# Patient Record
Sex: Female | Born: 1955 | Race: White | Hispanic: No | Smoking: Current some day smoker
Health system: Southern US, Community
[De-identification: ages and names within clinical notes are randomized; demographics above are authoritative.]

## PROBLEM LIST (undated history)

## (undated) DIAGNOSIS — I10 Essential (primary) hypertension: Secondary | ICD-10-CM

## (undated) DIAGNOSIS — K219 Gastro-esophageal reflux disease without esophagitis: Secondary | ICD-10-CM

## (undated) DIAGNOSIS — E785 Hyperlipidemia, unspecified: Secondary | ICD-10-CM

## (undated) HISTORY — PX: OTHER SURGICAL HISTORY: SHX169

## (undated) HISTORY — PX: BLADDER SUSPENSION: SHX72

---

## 2004-09-17 ENCOUNTER — Emergency Department: Payer: Self-pay | Admitting: Emergency Medicine

## 2005-05-21 ENCOUNTER — Emergency Department: Payer: Self-pay | Admitting: Emergency Medicine

## 2005-06-02 ENCOUNTER — Emergency Department: Payer: Self-pay | Admitting: Emergency Medicine

## 2005-09-22 ENCOUNTER — Other Ambulatory Visit: Payer: Self-pay

## 2005-09-22 ENCOUNTER — Emergency Department: Payer: Self-pay | Admitting: Emergency Medicine

## 2006-05-24 HISTORY — PX: CARPAL TUNNEL RELEASE: SHX101

## 2006-08-22 ENCOUNTER — Ambulatory Visit: Payer: Self-pay | Admitting: Orthopedic Surgery

## 2006-08-25 ENCOUNTER — Ambulatory Visit: Payer: Self-pay | Admitting: Orthopedic Surgery

## 2007-01-31 ENCOUNTER — Emergency Department: Payer: Self-pay | Admitting: Emergency Medicine

## 2007-01-31 ENCOUNTER — Other Ambulatory Visit: Payer: Self-pay

## 2007-10-04 ENCOUNTER — Ambulatory Visit: Payer: Self-pay | Admitting: Obstetrics and Gynecology

## 2007-11-27 ENCOUNTER — Ambulatory Visit: Payer: Self-pay | Admitting: Gastroenterology

## 2014-09-06 ENCOUNTER — Other Ambulatory Visit: Payer: Self-pay | Admitting: Oncology

## 2014-09-06 DIAGNOSIS — Z139 Encounter for screening, unspecified: Secondary | ICD-10-CM

## 2014-09-06 DIAGNOSIS — M8580 Other specified disorders of bone density and structure, unspecified site: Secondary | ICD-10-CM

## 2014-09-25 ENCOUNTER — Inpatient Hospital Stay: Payer: Self-pay | Attending: Oncology | Admitting: *Deleted

## 2014-09-25 ENCOUNTER — Ambulatory Visit
Admission: RE | Admit: 2014-09-25 | Discharge: 2014-09-25 | Disposition: A | Discharge status: Home / Self Care | Payer: Self-pay | Source: Ambulatory Visit | Attending: Oncology | Admitting: Oncology

## 2014-09-25 VITALS — BP 139/97 | HR 72 | Temp 97.2°F | Resp 18 | Ht 61.42 in | Wt 265.4 lb

## 2014-09-25 DIAGNOSIS — Z139 Encounter for screening, unspecified: Secondary | ICD-10-CM

## 2014-09-25 DIAGNOSIS — Z0000 Encounter for general adult medical examination without abnormal findings: Secondary | ICD-10-CM

## 2014-09-25 NOTE — Progress Notes (Signed)
Subjective:     Patient ID: Bianca Fitzpatrick, female   DOB: [birth year], 57 y.o.   MRN: [id number]  HPI   Review of Systems     Objective:   Physical Exam  Pulmonary/Chest: Right breast exhibits no inverted nipple, no mass, no nipple discharge, no skin change and no tenderness. Left breast exhibits no inverted nipple, no mass, no nipple discharge, no skin change and no tenderness.    Genitourinary: Rectum normal. Uterus is not deviated, not enlarged, not fixed and not tender. Cervix exhibits no discharge and no friability. Right adnexum displays no mass, no tenderness and no fullness. Left adnexum displays no mass, no tenderness and no fullness.       Assessment:     59 year old White female presents to [facility] for cbe, pap and mammogram.  Clinical breast exam unremarkable.  Screening mammogram ordered.  Pelvic exam without masses or lesions.  Rectocele noted.  Difficult to visualize the cervical os.  Specimen collected for pap smear.      Plan:     Follow up per protocol.

## 2014-09-27 ENCOUNTER — Encounter: Payer: Self-pay | Admitting: *Deleted

## 2014-09-27 NOTE — Progress Notes (Unsigned)
Patient with normal mammogram resutls.  Letter mailed from the [facility] to inform her of her results.  Patient is to follow-up in one year with annual screening.  HSIS to [place].

## 2014-10-02 ENCOUNTER — Encounter: Payer: Self-pay | Admitting: *Deleted

## 2014-10-03 ENCOUNTER — Encounter: Payer: Self-pay | Admitting: *Deleted

## 2014-10-03 NOTE — Progress Notes (Signed)
Patient ID: Bianca Fitzpatrick, female   DOB: 1955/12/15, 59 y.o.   MRN: [id number] Patient had HPV negative pap.  Mailed letter to inform patient of her normal pap smear.  She will need to return for annual pap in 5 years.

## 2014-10-09 ENCOUNTER — Other Ambulatory Visit: Payer: Self-pay | Admitting: Oncology

## 2015-06-12 ENCOUNTER — Encounter: Payer: Self-pay | Admitting: *Deleted

## 2015-06-13 ENCOUNTER — Encounter: Payer: Self-pay | Admitting: *Deleted

## 2015-06-13 ENCOUNTER — Encounter
Admission: RE | Disposition: A | Discharge status: Home Health | Payer: Self-pay | Source: Ambulatory Visit | Attending: Gastroenterology

## 2015-06-13 ENCOUNTER — Ambulatory Visit: Payer: Managed Care, Other (non HMO) | Admitting: *Deleted

## 2015-06-13 ENCOUNTER — Ambulatory Visit
Admission: RE | Admit: 2015-06-13 | Discharge: 2015-06-13 | Disposition: A | Discharge status: Home Health | Payer: Managed Care, Other (non HMO) | Source: Ambulatory Visit | Attending: Gastroenterology | Admitting: Gastroenterology

## 2015-06-13 DIAGNOSIS — K2960 Other gastritis without bleeding: Secondary | ICD-10-CM | POA: Insufficient documentation

## 2015-06-13 DIAGNOSIS — K222 Esophageal obstruction: Secondary | ICD-10-CM | POA: Insufficient documentation

## 2015-06-13 DIAGNOSIS — K210 Gastro-esophageal reflux disease with esophagitis: Secondary | ICD-10-CM | POA: Diagnosis not present

## 2015-06-13 DIAGNOSIS — K2980 Duodenitis without bleeding: Secondary | ICD-10-CM | POA: Diagnosis not present

## 2015-06-13 DIAGNOSIS — K635 Polyp of colon: Secondary | ICD-10-CM | POA: Insufficient documentation

## 2015-06-13 DIAGNOSIS — I10 Essential (primary) hypertension: Secondary | ICD-10-CM | POA: Diagnosis not present

## 2015-06-13 DIAGNOSIS — K449 Diaphragmatic hernia without obstruction or gangrene: Secondary | ICD-10-CM | POA: Diagnosis not present

## 2015-06-13 DIAGNOSIS — K640 First degree hemorrhoids: Secondary | ICD-10-CM | POA: Diagnosis not present

## 2015-06-13 DIAGNOSIS — Z86010 Personal history of colonic polyps: Secondary | ICD-10-CM | POA: Diagnosis present

## 2015-06-13 DIAGNOSIS — K5730 Diverticulosis of large intestine without perforation or abscess without bleeding: Secondary | ICD-10-CM | POA: Diagnosis not present

## 2015-06-13 DIAGNOSIS — D124 Benign neoplasm of descending colon: Secondary | ICD-10-CM | POA: Diagnosis not present

## 2015-06-13 DIAGNOSIS — K2930 Chronic superficial gastritis without bleeding: Secondary | ICD-10-CM | POA: Diagnosis not present

## 2015-06-13 DIAGNOSIS — Z1211 Encounter for screening for malignant neoplasm of colon: Secondary | ICD-10-CM | POA: Diagnosis present

## 2015-06-13 DIAGNOSIS — E785 Hyperlipidemia, unspecified: Secondary | ICD-10-CM | POA: Diagnosis not present

## 2015-06-13 DIAGNOSIS — K621 Rectal polyp: Secondary | ICD-10-CM | POA: Diagnosis not present

## 2015-06-13 DIAGNOSIS — K644 Residual hemorrhoidal skin tags: Secondary | ICD-10-CM | POA: Insufficient documentation

## 2015-06-13 DIAGNOSIS — K2270 Barrett's esophagus without dysplasia: Secondary | ICD-10-CM | POA: Diagnosis present

## 2015-06-13 HISTORY — DX: Essential (primary) hypertension: I10

## 2015-06-13 HISTORY — PX: COLONOSCOPY WITH PROPOFOL: SHX5780

## 2015-06-13 HISTORY — DX: Gastro-esophageal reflux disease without esophagitis: K21.9

## 2015-06-13 HISTORY — DX: Hyperlipidemia, unspecified: E78.5

## 2015-06-13 HISTORY — PX: ESOPHAGOGASTRODUODENOSCOPY (EGD) WITH PROPOFOL: SHX5813

## 2015-06-13 SURGERY — COLONOSCOPY WITH PROPOFOL
Anesthesia: General

## 2015-06-13 MED ORDER — SODIUM CHLORIDE 0.9 % IV SOLN: INTRAVENOUS | Status: DC

## 2015-06-13 MED ORDER — SODIUM CHLORIDE 0.9 % IV SOLN
INTRAVENOUS | Status: DC
  Administered 2015-06-13: 11:00:00 via INTRAVENOUS

## 2015-06-13 MED ORDER — LACTATED RINGERS IV SOLN
INTRAVENOUS | Status: DC | PRN
  Administered 2015-06-13: 11:00:00 via INTRAVENOUS

## 2015-06-13 MED ORDER — PROPOFOL 10 MG/ML IV BOLUS
INTRAVENOUS | Status: DC | PRN
  Administered 2015-06-13: 100 mg via INTRAVENOUS

## 2015-06-13 MED ORDER — PROPOFOL 500 MG/50ML IV EMUL
INTRAVENOUS | Status: DC | PRN
  Administered 2015-06-13: 150 ug/kg/min via INTRAVENOUS

## 2015-06-13 NOTE — Op Note (Signed)
[facility] Gastroenterology Patient Name: Bianca Fitzpatrick Procedure Date: 06/13/2015 11:06 AM MRN: [id number] Account #: [id number] Date of Birth: 06-Dec-1955 Admit Type: Outpatient Age: 60 Room: [facility] ENDO ROOM 3 Gender: Female Note Status: Finalized Procedure:         Upper GI endoscopy Indications:       Follow-up of Barrett's esophagus Providers:         Lollie Sails, MD Referring MD:      Bethena Roys. Sowles, MD (Referring MD) Medicines:         Monitored Anesthesia Care Complications:     No immediate complications. Procedure:         Pre-Anesthesia Assessment:                    - ASA Grade Assessment: III - A patient with severe                     systemic disease.                    After obtaining informed consent, the endoscope was passed                     under direct vision. Throughout the procedure, the                     patient's blood pressure, pulse, and oxygen saturations                     were monitored continuously. The Olympus GIF-160 endoscope                     (S#. [phone]) was introduced through the mouth, and                     advanced to the third part of duodenum. The upper GI                     endoscopy was accomplished without difficulty. The patient                     tolerated the procedure well. Findings:      A non-obstructing Schatzki ring (acquired) was found at the       gastroesophageal junction. Biopsies were taken with a cold forceps for       histology.      The Z-line was regular. Mucosa was biopsied with a cold forceps for       histology in 4 quadrants.      mild furrowing noted in the mid to upper esophagus. Biopsies taken at       about 28 cm.      A small hiatus hernia was present.      Patchy moderate inflammation characterized by erosions and linear       erosions was found in the gastric antrum. Biopsies were taken with a       cold forceps for histology. Biopsies were taken with a cold forceps for        Helicobacter pylori testing.      The cardia and gastric fundus were normal on retroflexion otherwise.      Patchy minimal inflammation characterized by erythema was found in the       duodenal bulb.      The exam of the duodenum was otherwise normal. Impression:        -  Non-obstructing Schatzki ring. Biopsied.                    - Z-line regular. Biopsied.                    - Small hiatus hernia.                    - Erosive gastritis. Biopsied.                    - Duodenitis. Recommendation:    - Await pathology results.                    - Use Protonix (pantoprazole) 40 mg PO daily daily. Procedure Code(s): --- Professional ---                    [phone], Esophagogastroduodenoscopy, flexible, transoral;                     with biopsy, single or multiple Diagnosis Code(s): --- Professional ---                    K22.2, Esophageal obstruction                    K22.70, Barrett's esophagus without dysplasia                    K44.9, Diaphragmatic hernia without obstruction or gangrene                    K29.60, Other gastritis without bleeding                    K29.80, Duodenitis without bleeding CPT copyright 2014 American Medical Association. All rights reserved. The codes documented in this report are preliminary and upon coder review may  be revised to meet current compliance requirements. Lollie Sails, MD 06/13/2015 11:47:03 AM This report has been signed electronically. Number of Addenda: 0 Note Initiated On: 06/13/2015 11:06 AM      [facility]

## 2015-06-13 NOTE — Transfer of Care (Signed)
Immediate Anesthesia Transfer of Care Note  Patient: Bianca Fitzpatrick  Procedure(s) Performed: Procedure(s): COLONOSCOPY WITH PROPOFOL (N/A) ESOPHAGOGASTRODUODENOSCOPY (EGD) WITH PROPOFOL (N/A)  Patient Location: PACU and Endoscopy Unit  Anesthesia Type:General  Level of Consciousness: awake, alert  and oriented  Airway & Oxygen Therapy: Patient Spontanous Breathing and Patient connected to nasal cannula oxygen  Post-op Assessment: Report given to RN and Post -op Vital signs reviewed and stable  Post vital signs: Reviewed and stable  Last Vitals:  Filed Vitals:   06/13/15 1004  BP: 152/85  Pulse: 66  Temp: 36.3 C  Resp: 18    Complications: No apparent anesthesia complications

## 2015-06-13 NOTE — Anesthesia Preprocedure Evaluation (Signed)
Anesthesia Evaluation  Patient identified by MRN, date of birth, ID band  Reviewed: Allergy & Precautions, NPO status , Patient's Chart, lab work & pertinent test results  Airway Mallampati: II  TM Distance: >3 FB Neck ROM: Full    Dental  (+) Teeth Intact   Pulmonary Current Smoker,  One half pack per day.   Pulmonary exam normal        Cardiovascular Exercise Tolerance: Good hypertension, Pt. on medications Normal cardiovascular exam     Neuro/Psych    GI/Hepatic GERD  Medicated and Controlled,  Endo/Other  Morbid obesity  Renal/GU      Musculoskeletal   Abdominal (+) + obese,  Abdomen: soft.    Peds  Hematology   Anesthesia Other Findings   Reproductive/Obstetrics                             Anesthesia Physical Anesthesia Plan  ASA: III  Anesthesia Plan: General   Post-op Pain Management:    Induction: Intravenous  Airway Management Planned: Nasal Cannula  Additional Equipment:   Intra-op Plan:   Post-operative Plan:   Informed Consent: I have reviewed the patients History and Physical, chart, labs and discussed the procedure including the risks, benefits and alternatives for the proposed anesthesia with the patient or authorized representative who has indicated his/her understanding and acceptance.   Dental advisory given  Plan Discussed with:   Anesthesia Plan Comments:         Anesthesia Quick Evaluation

## 2015-06-13 NOTE — Anesthesia Postprocedure Evaluation (Signed)
Anesthesia Post Note  Patient: Bianca Fitzpatrick  Procedure(s) Performed: Procedure(s) (LRB): COLONOSCOPY WITH PROPOFOL (N/A) ESOPHAGOGASTRODUODENOSCOPY (EGD) WITH PROPOFOL (N/A)  Patient location during evaluation: PACU Anesthesia Type: General Level of consciousness: awake Pain management: pain level controlled Vital Signs Assessment: post-procedure vital signs reviewed and stable Respiratory status: spontaneous breathing Cardiovascular status: blood pressure returned to baseline Postop Assessment: no headache Anesthetic complications: no    Last Vitals:  Filed Vitals:   06/13/15 1241 06/13/15 1252  BP: 127/75 119/75  Pulse: 76 67  Temp:    Resp: 12 33    Last Pain: There were no vitals filed for this visit.               Shantara Goosby M

## 2015-06-13 NOTE — H&P (Signed)
Outpatient short stay form Pre-procedure 06/13/2015 11:14 AM Lollie Sails MD  Primary Physician: Dr. Sabino Snipes  Reason for visit:  EGD and colonoscopy  History of present illness:  Patient is a 60 year old female presenting today for EGD and colonoscopy in regards to her personal history of colon polyps and Barrett's esophagus. Her last EGD was actually in 2009. She has not been taking a proton pump inhibitor. She has been taking ranitidine at times. She denies any dysphagia or clinically significant heartburn.   Current facility-administered medications:  .  0.9 %  sodium chloride infusion, , Intravenous, Continuous, Lollie Sails, MD, Last Rate: 20 mL/hr at 06/13/15 1030 .  0.9 %  sodium chloride infusion, , Intravenous, Continuous, Lollie Sails, MD  Prescriptions prior to admission  Medication Sig Dispense Refill Last Dose  . amLODipine (NORVASC) 5 MG tablet Take 5 mg by mouth daily.   06/13/2015 at 0530  . atorvastatin (LIPITOR) 40 MG tablet Take 40 mg by mouth daily.   06/13/2015 at 0530  . hydrochlorothiazide (HYDRODIURIL) 25 MG tablet Take 25 mg by mouth daily.   06/13/2015 at 0530  . ranitidine (ZANTAC) 75 MG tablet Take 75 mg by mouth 2 (two) times daily.   06/13/2015 at 0530     No Known Allergies   Past Medical History  Diagnosis Date  . GERD (gastroesophageal reflux disease)   . Hypertension   . Hyperlipidemia     Review of systems:      Physical Exam    Heart and lungs: Regular rate and rhythm without rub or gallop, lungs are bilaterally clear.    HEENT: Norm cephalic atraumatic eyes are anicteric    Other:     Pertinant exam for procedure: Soft nontender nondistended bowel sounds positive normoactive    Planned proceedures: EGD, colonoscopy and indicated procedures. I have discussed the risks benefits and complications of procedures to include not limited to bleeding, infection, perforation and the risk of sedation and the patient wishes  to proceed.    Lollie Sails, MD Gastroenterology 06/13/2015  11:14 AM

## 2015-06-13 NOTE — Op Note (Signed)
[facility] Gastroenterology Patient Name: Bianca Fitzpatrick Procedure Date: 06/13/2015 11:04 AM MRN: [id number] Account #: [id number] Date of Birth: 09-22-1955 Admit Type: Outpatient Age: 60 Room: [facility] ENDO ROOM 3 Gender: Female Note Status: Finalized Procedure:         Colonoscopy Indications:       Personal history of colonic polyps Providers:         Lollie Sails, MD Referring MD:      Bethena Roys. Sowles, MD (Referring MD) Medicines:         Monitored Anesthesia Care Complications:     No immediate complications. Procedure:         Pre-Anesthesia Assessment:                    - ASA Grade Assessment: III - A patient with severe                     systemic disease.                    After obtaining informed consent, the colonoscope was                     passed under direct vision. Throughout the procedure, the                     patient's blood pressure, pulse, and oxygen saturations                     were monitored continuously. The Olympus PCF-H180AL                     colonoscope ( S#: [id number] ) was introduced through the                     anus and advanced to the the cecum, identified by                     appendiceal orifice and ileocecal valve. The colonoscopy                     was performed with moderate difficulty. Successful                     completion of the procedure was aided by changing the                     patient to a supine position, changing the patient to a                     prone position and using manual pressure. The patient                     tolerated the procedure well. The quality of the bowel                     preparation was fair except the cecum was poor. Findings:      Multiple small and large-mouthed diverticula were found in the sigmoid       colon, in the descending colon, in the transverse colon and in the       ascending colon.      A 4 mm polyp was found in the descending colon. The polyp was sessile.        The polyp  was removed with a cold snare. Resection and retrieval were       complete.      A 3 mm polyp was found in the descending colon. The polyp was sessile.       The polyp was removed with a cold biopsy forceps. Resection and       retrieval were complete.      A 3 mm polyp was found in the mid sigmoid colon. The polyp was sessile.       The polyp was removed with a cold biopsy forceps. Resection and       retrieval were complete.      Seven sessile polyps were found in the recto-sigmoid colon. The polyps       were 1 to 2 mm in size. These polyps were removed with a cold biopsy       forceps. Resection and retrieval were complete.      Four sessile polyps were found in the rectum. The polyps were 1 to 3 mm       in size. These polyps were removed with a cold biopsy forceps. Resection       and retrieval were complete.      Non-bleeding external and internal hemorrhoids were found during       anoscopy. The hemorrhoids were small and Grade I (internal hemorrhoids       that do not prolapse).      The digital rectal exam was normal otherwise. Impression:        - Diverticulosis in the sigmoid colon, in the descending                     colon, in the transverse colon and in the ascending colon.                    - One 4 mm polyp in the descending colon. Resected and                     retrieved.                    - One 3 mm polyp in the descending colon. Resected and                     retrieved.                    - One 3 mm polyp in the mid sigmoid colon. Resected and                     retrieved.                    - Seven 1 to 2 mm polyps at the recto-sigmoid colon.                     Resected and retrieved.                    - Four 1 to 3 mm polyps in the rectum. Resected and                     retrieved.                    - Non-bleeding external and internal hemorrhoids. Recommendation:    - Discharge patient to home.                    -  Await pathology  results.                    - Telephone GI clinic for pathology results in 1 week. Procedure Code(s): --- Professional ---                    [phone], Colonoscopy, flexible; with removal of tumor(s),                     polyp(s), or other lesion(s) by snare technique                    45380, 25, Colonoscopy, flexible; with biopsy, single or                     multiple Diagnosis Code(s): --- Professional ---                    K64.0, First degree hemorrhoids                    D12.4, Benign neoplasm of descending colon                    D12.5, Benign neoplasm of sigmoid colon                    D12.7, Benign neoplasm of rectosigmoid junction                    K62.1, Rectal polyp                    Z86.010, Personal history of colonic polyps                    K57.30, Diverticulosis of large intestine without                     perforation or abscess without bleeding CPT copyright 2014 American Medical Association. All rights reserved. The codes documented in this report are preliminary and upon coder review may  be revised to meet current compliance requirements. Lollie Sails, MD 06/13/2015 12:29:33 PM This report has been signed electronically. Number of Addenda: 0 Note Initiated On: 06/13/2015 11:04 AM Scope Withdrawal Time: 0 hours 18 minutes 23 seconds  Total Procedure Duration: 0 hours 33 minutes 53 seconds       [facility]

## 2015-06-16 LAB — SURGICAL PATHOLOGY

## 2015-06-18 ENCOUNTER — Encounter: Payer: Self-pay | Admitting: Gastroenterology

## 2015-06-27 ENCOUNTER — Encounter: Payer: Self-pay | Admitting: Gastroenterology

## 2015-06-27 NOTE — Addendum Note (Signed)
Addendum  created 06/27/15 [id number] by Elyse Hsu, MD   Modules edited: Anesthesia Events, Narrator   Narrator:  Narrator: Event Log Edited

## 2015-09-30 ENCOUNTER — Other Ambulatory Visit: Payer: Self-pay | Admitting: Family Medicine

## 2015-09-30 DIAGNOSIS — Z1231 Encounter for screening mammogram for malignant neoplasm of breast: Secondary | ICD-10-CM

## 2016-01-01 ENCOUNTER — Ambulatory Visit
Admission: RE | Admit: 2016-01-01 | Discharge: 2016-01-01 | Disposition: A | Discharge status: Home / Self Care | Payer: Managed Care, Other (non HMO) | Source: Ambulatory Visit | Attending: Family Medicine | Admitting: Family Medicine

## 2016-01-01 DIAGNOSIS — Z1231 Encounter for screening mammogram for malignant neoplasm of breast: Secondary | ICD-10-CM | POA: Diagnosis not present

## 2016-04-19 ENCOUNTER — Encounter: Payer: Self-pay | Admitting: Emergency Medicine

## 2016-04-19 ENCOUNTER — Emergency Department
Admission: EM | Admit: 2016-04-19 | Discharge: 2016-04-20 | Disposition: A | Discharge status: Home / Self Care | Payer: Managed Care, Other (non HMO) | Attending: Emergency Medicine | Admitting: Emergency Medicine

## 2016-04-19 DIAGNOSIS — Z79899 Other long term (current) drug therapy: Secondary | ICD-10-CM | POA: Insufficient documentation

## 2016-04-19 DIAGNOSIS — F17210 Nicotine dependence, cigarettes, uncomplicated: Secondary | ICD-10-CM | POA: Diagnosis not present

## 2016-04-19 DIAGNOSIS — I83892 Varicose veins of left lower extremities with other complications: Secondary | ICD-10-CM

## 2016-04-19 DIAGNOSIS — I10 Essential (primary) hypertension: Secondary | ICD-10-CM | POA: Diagnosis not present

## 2016-04-19 DIAGNOSIS — I8392 Asymptomatic varicose veins of left lower extremity: Secondary | ICD-10-CM | POA: Diagnosis present

## 2016-04-19 MED ORDER — LIDOCAINE-EPINEPHRINE (PF) 1 %-1:200000 IJ SOLN
10.0000 mL | Freq: Once | INTRAMUSCULAR | Status: AC
  Administered 2016-04-20: 10 mL
  Filled 2016-04-19: qty 30

## 2016-04-19 NOTE — ED Triage Notes (Signed)
Patient states that around 9pm she picked a scab on her left leg and blood shot out and has been bleeding since. Husband applied a dressing at home and patient still bleeding.

## 2016-04-19 NOTE — ED Provider Notes (Signed)
[facility] Emergency Department Provider Note  ____________________________________________  Time seen: Approximately 11:00 PM  I have reviewed the triage vital signs and the nursing notes.   HISTORY  Chief Complaint Leg Injury    HPI Bianca Fitzpatrick is a 60 y.o. female who presents emergency department for bleeding wound to her left leg. Patient states that she is getting ready to get into bed when she felt something "itching" in her leg. As she scratched, she felt something that felt like a scab. She states that her fingernail is tenderness and then she proceeded to have bleeding from the site. Patient states that it was a steady stream of blood. She denies any pulsating. Patient denies any pain to the area. Patient does endorse having varicose veins. She denies any previous history of same. She is not on blood thinners.   Past Medical History:  Diagnosis Date  . GERD (gastroesophageal reflux disease)   . Hyperlipidemia   . Hypertension     There are no active problems to display for this patient.   Past Surgical History:  Procedure Laterality Date  . BLADDER SUSPENSION    . CARPAL TUNNEL RELEASE Right 2008  . CESAREAN SECTION     x 2  . COLONOSCOPY WITH PROPOFOL N/A 06/13/2015   Procedure: COLONOSCOPY WITH PROPOFOL;  Surgeon: Lollie Sails, MD;  Location: [facility] ENDOSCOPY;  Service: Endoscopy;  Laterality: N/A;  . ESOPHAGOGASTRODUODENOSCOPY (EGD) WITH PROPOFOL N/A 06/13/2015   Procedure: ESOPHAGOGASTRODUODENOSCOPY (EGD) WITH PROPOFOL;  Surgeon: Lollie Sails, MD;  Location: [facility] ENDOSCOPY;  Service: Endoscopy;  Laterality: N/A;  . right carpal tunnel release      Prior to Admission medications   Medication Sig Start Date End Date Taking? Authorizing Provider  amLODipine (NORVASC) 5 MG tablet Take 5 mg by mouth daily.    Historical Provider, MD  atorvastatin (LIPITOR) 40 MG tablet Take 40 mg by mouth daily.    Historical Provider, MD   hydrochlorothiazide (HYDRODIURIL) 25 MG tablet Take 25 mg by mouth daily.    Historical Provider, MD  ranitidine (ZANTAC) 75 MG tablet Take 75 mg by mouth 2 (two) times daily.    Historical Provider, MD    Allergies Patient has no known allergies.  Family History  Problem Relation Age of Onset  . Breast cancer Paternal Aunt   . Breast cancer Maternal Aunt   . Breast cancer Cousin     Social History Social History  Substance Use Topics  . Smoking status: Current Some Day Smoker    Packs/day: 0.50    Years: 30.00    Types: Cigarettes  . Smokeless tobacco: Not on file     Comment: quitline information given  . Alcohol use 0.6 oz/week    1 Standard drinks or equivalent per week     Review of Systems  Constitutional: No fever/chills Cardiovascular: no chest pain. Respiratory: no cough. No SOB. Musculoskeletal: Negative for musculoskeletal pain. Skin:Positive for bleeding wound to the left lower leg Neurological: Negative for headaches, focal weakness or numbness. 10-point ROS otherwise negative.  ____________________________________________   PHYSICAL EXAM:  VITAL SIGNS: ED Triage Vitals  Enc Vitals Group     BP 04/19/16 2212 121/72     Pulse Rate 04/19/16 2212 80     Resp 04/19/16 2212 18     Temp 04/19/16 2212 97.7 F (36.5 C)     Temp Source 04/19/16 2212 Oral     SpO2 04/19/16 2212 95 %     Weight  04/19/16 2213 227 lb (103 kg)     Height 04/19/16 2213 5' (1.524 m)     Head Circumference --      Peak Flow --      Pain Score --      Pain Loc --      Pain Edu? --      Excl. in [place]? --      Constitutional: Alert and oriented. Well appearing and in no acute distress. Eyes: Conjunctivae are normal. PERRL. EOMI. Head: Atraumatic. Neck: No stridor.    Cardiovascular: Normal rate, regular rhythm. Normal S1 and S2.  Good peripheral circulation. Respiratory: Normal respiratory effort without tachypnea or retractions. Lungs CTAB. Good air entry to the bases  with no decreased or absent breath sounds. Musculoskeletal: Full range of motion to all extremities. No gross deformities appreciated. Neurologic:  Normal speech and language. No gross focal neurologic deficits are appreciated.  Skin:  Skin is warm, dry and intact. Small open lesion noted to left lateral lower leg. Area is covered in bandages, when this is removed bleeding doesn't resume. This appears to be bleeding varicose vein. No pulsating. Nontender to palpation. Dorsalis pedis pulse intact distally. Sensation intact distally. Psychiatric: Mood and affect are normal. Speech and behavior are normal. Patient exhibits appropriate insight and judgement.   ____________________________________________   LABS (all labs ordered are listed, but only abnormal results are displayed)  Labs Reviewed - No data to display ____________________________________________  EKG   ____________________________________________  RADIOLOGY   No results found.  ____________________________________________    PROCEDURES  Procedure(s) performed:    Wound repair Date/Time: 04/20/2016 12:18 AM Performed by: Betha Loa D Authorized by: Betha Loa D  Consent: Verbal consent obtained. Consent given by: patient Patient understanding: patient states understanding of the procedure being performed Local anesthesia used: yes Anesthesia: local infiltration  Anesthesia: Local anesthesia used: yes Local Anesthetic: lidocaine 1% with epinephrine Anesthetic total: 8 mL Comments: Bleeding varicose pain is infiltrated using lidocaine with epinephrine. Direct pressure is not applied after injection. After 10 minutes, the wound is rechecked with complete cessation of bleeding. Pressure dressing is reapplied and patient is instructed to leave in place for 2 days. Wound care retractions are given to patient to return for any bleeding through or increased pain. Otherwise, patient will remove  dressing in 2 days and cover area with Band-Aid until scab has completely resolved.       Medications  lidocaine-EPINEPHrine (XYLOCAINE-EPINEPHrine) 1 %-1:200000 (PF) injection 10 mL (not administered)     ____________________________________________   INITIAL IMPRESSION / ASSESSMENT AND PLAN / ED COURSE  Pertinent labs & imaging results that were available during my care of the patient were reviewed by me and considered in my medical decision making (see chart for details).  Review of the [place] CSRS was performed in accordance of the [place] prior to dispensing any controlled drugs.  Clinical Course     Patient's diagnosis is consistent with Bleeding varicose pain to the left lower extremity. This is controlled after infiltration with lidocaine with epi and direct pressure. No return of bleeding. Pressure dressing is reapplied to the site and patient is instructed to leave in place for 2 days. She is given wound care section. No prescriptions at this time. Patient will follow up with primary care as needed..  Patient is given ED precautions to return to the ED for any worsening or new symptoms.     ____________________________________________  FINAL CLINICAL IMPRESSION(S) / ED DIAGNOSES  Final diagnoses:  Bleeding from varicose veins of left lower extremity      NEW MEDICATIONS STARTED DURING THIS VISIT:  New Prescriptions   No medications on file        This chart was dictated using voice recognition software/Dragon. Despite best efforts to proofread, errors can occur which can change the meaning. Any change was purely unintentional.    Darletta Moll, PA-C 04/20/16 0024    Schuyler Amor, MD 04/21/16 [phone]

## 2016-11-03 ENCOUNTER — Other Ambulatory Visit: Payer: Self-pay | Admitting: Family Medicine

## 2016-11-03 DIAGNOSIS — Z1231 Encounter for screening mammogram for malignant neoplasm of breast: Secondary | ICD-10-CM

## 2017-01-03 ENCOUNTER — Ambulatory Visit: Payer: Managed Care, Other (non HMO)

## 2017-06-17 ENCOUNTER — Ambulatory Visit
Admission: RE | Admit: 2017-06-17 | Discharge: 2017-06-17 | Disposition: A | Discharge status: Home / Self Care | Payer: PRIVATE HEALTH INSURANCE | Source: Ambulatory Visit | Attending: Family Medicine | Admitting: Family Medicine

## 2017-06-17 DIAGNOSIS — Z1231 Encounter for screening mammogram for malignant neoplasm of breast: Secondary | ICD-10-CM

## 2018-08-03 ENCOUNTER — Other Ambulatory Visit: Payer: Self-pay | Admitting: Family Medicine

## 2018-12-27 ENCOUNTER — Other Ambulatory Visit: Payer: Self-pay | Admitting: Unknown Physician Specialty

## 2018-12-27 DIAGNOSIS — H903 Sensorineural hearing loss, bilateral: Secondary | ICD-10-CM

## 2019-01-02 ENCOUNTER — Other Ambulatory Visit: Payer: Self-pay | Admitting: Physician Assistant

## 2019-01-02 DIAGNOSIS — Z1231 Encounter for screening mammogram for malignant neoplasm of breast: Secondary | ICD-10-CM

## 2019-01-07 ENCOUNTER — Ambulatory Visit
Admission: RE | Admit: 2019-01-07 | Discharge: 2019-01-07 | Disposition: A | Discharge status: Home / Self Care | Payer: Self-pay | Source: Ambulatory Visit | Attending: Unknown Physician Specialty | Admitting: Unknown Physician Specialty

## 2019-01-07 DIAGNOSIS — H903 Sensorineural hearing loss, bilateral: Secondary | ICD-10-CM | POA: Insufficient documentation

## 2019-01-07 LAB — POCT I-STAT CREATININE: Creatinine, Ser: 0.7 mg/dL (ref 0.44–1.00)

## 2019-01-07 MED ORDER — GADOBUTROL 1 MMOL/ML IV SOLN
10.0000 mL | Freq: Once | INTRAVENOUS | Status: AC | PRN
Start: 2019-01-07 — End: 2019-01-07
  Administered 2019-01-07: 10 mL via INTRAVENOUS

## 2019-10-02 ENCOUNTER — Ambulatory Visit
Admission: RE | Admit: 2019-10-02 | Discharge: 2019-10-02 | Disposition: A | Discharge status: Home / Self Care | Payer: PRIVATE HEALTH INSURANCE | Source: Ambulatory Visit | Attending: Physician Assistant | Admitting: Physician Assistant

## 2019-10-02 DIAGNOSIS — Z1231 Encounter for screening mammogram for malignant neoplasm of breast: Secondary | ICD-10-CM | POA: Insufficient documentation

## 2019-11-09 ENCOUNTER — Ambulatory Visit
Admission: RE | Admit: 2019-11-09 | Discharge: 2019-11-09 | Disposition: A | Discharge status: Home / Self Care | Payer: Disability Insurance | Source: Ambulatory Visit | Attending: Internal Medicine | Admitting: Internal Medicine

## 2019-11-09 ENCOUNTER — Ambulatory Visit
Admission: RE | Admit: 2019-11-09 | Discharge: 2019-11-09 | Disposition: A | Discharge status: Home / Self Care | Payer: Disability Insurance | Attending: Internal Medicine | Admitting: Internal Medicine

## 2019-11-09 ENCOUNTER — Other Ambulatory Visit: Payer: Self-pay | Admitting: Internal Medicine

## 2019-11-09 DIAGNOSIS — M25551 Pain in right hip: Secondary | ICD-10-CM | POA: Insufficient documentation

## 2019-11-09 DIAGNOSIS — M25552 Pain in left hip: Secondary | ICD-10-CM

## 2020-08-27 IMAGING — CR DG HIP (WITH OR WITHOUT PELVIS) 2-3V*R*
3 series · 3 of 3 positions shown · non-contrast
Comparison: None.

CLINICAL DATA: Bilateral hip pain radiating into the legs for
years. No known injury.

EXAM:
DG HIP (WITH OR WITHOUT PELVIS) 2-3V LEFT; DG HIP (WITH OR WITHOUT
PELVIS) 2-3V RIGHT

[pelvis ap]
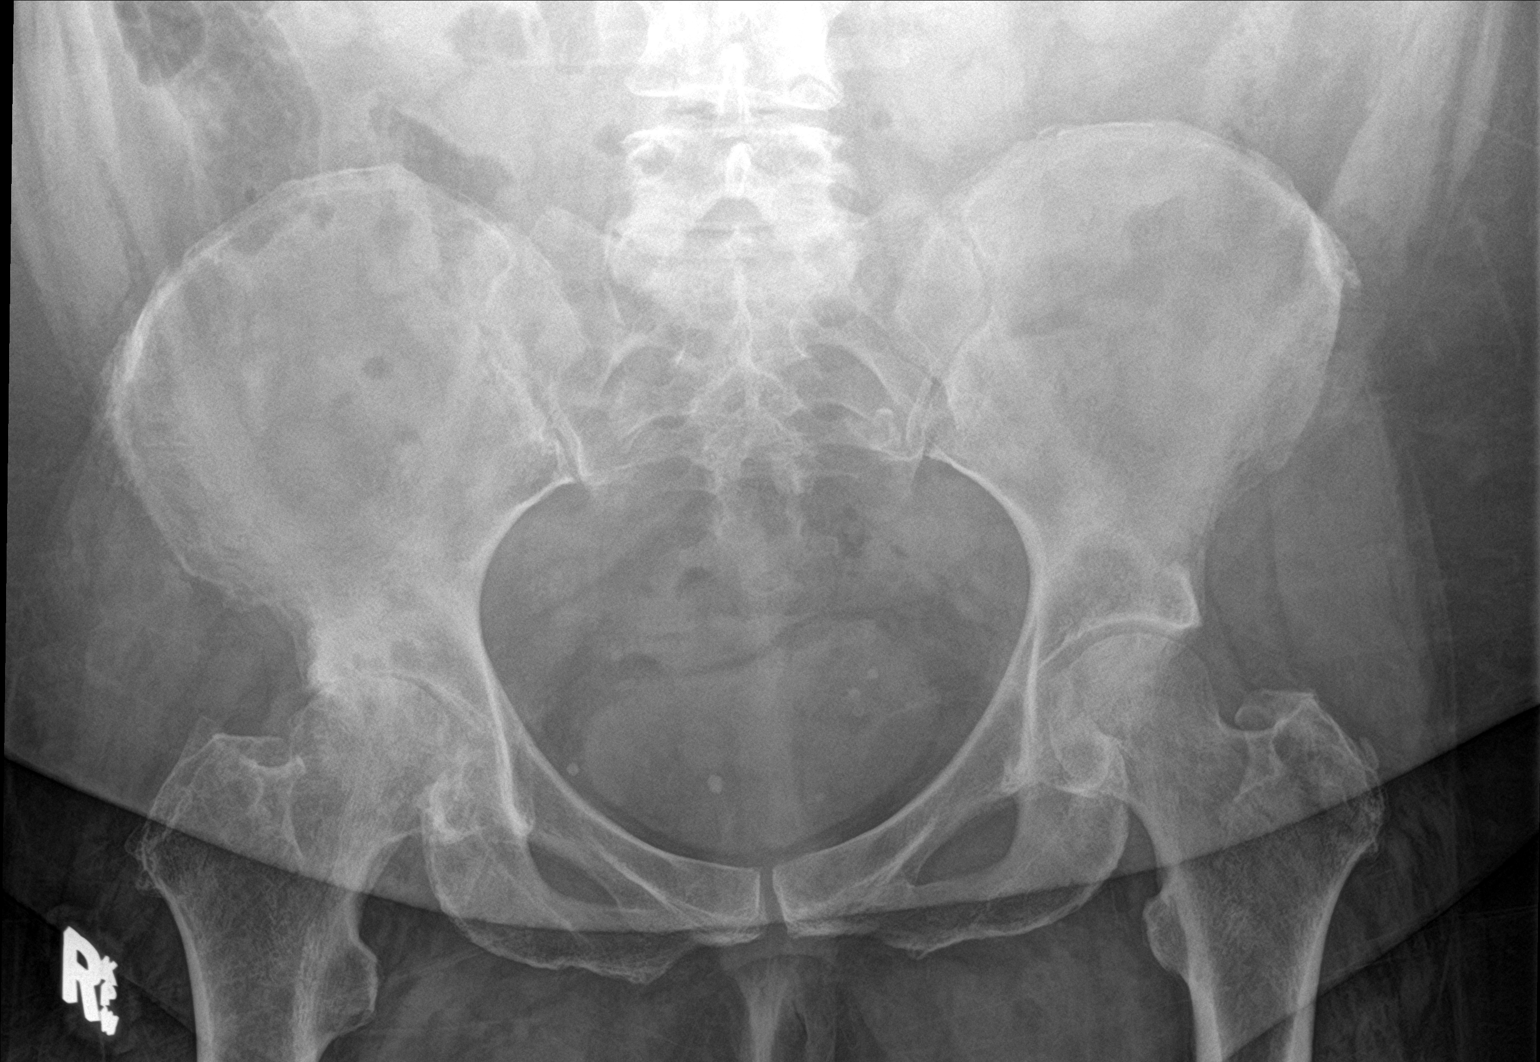

[hip ap]
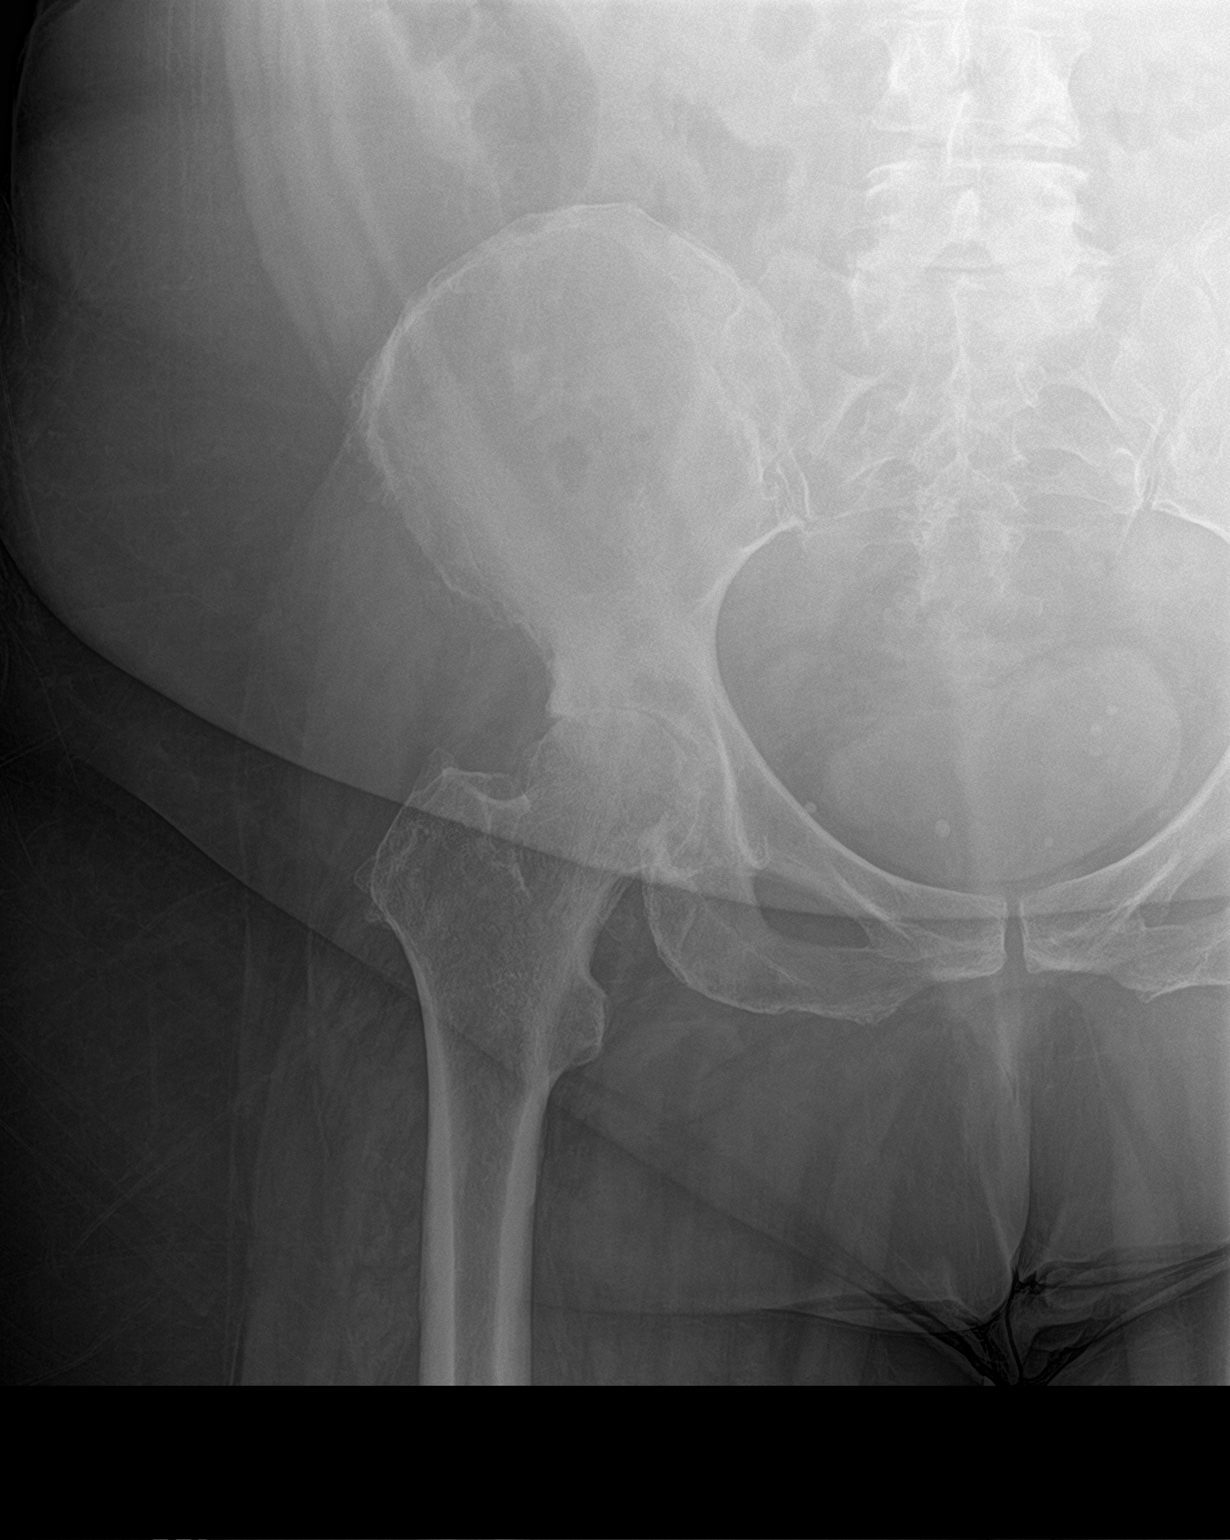

[hip lat]
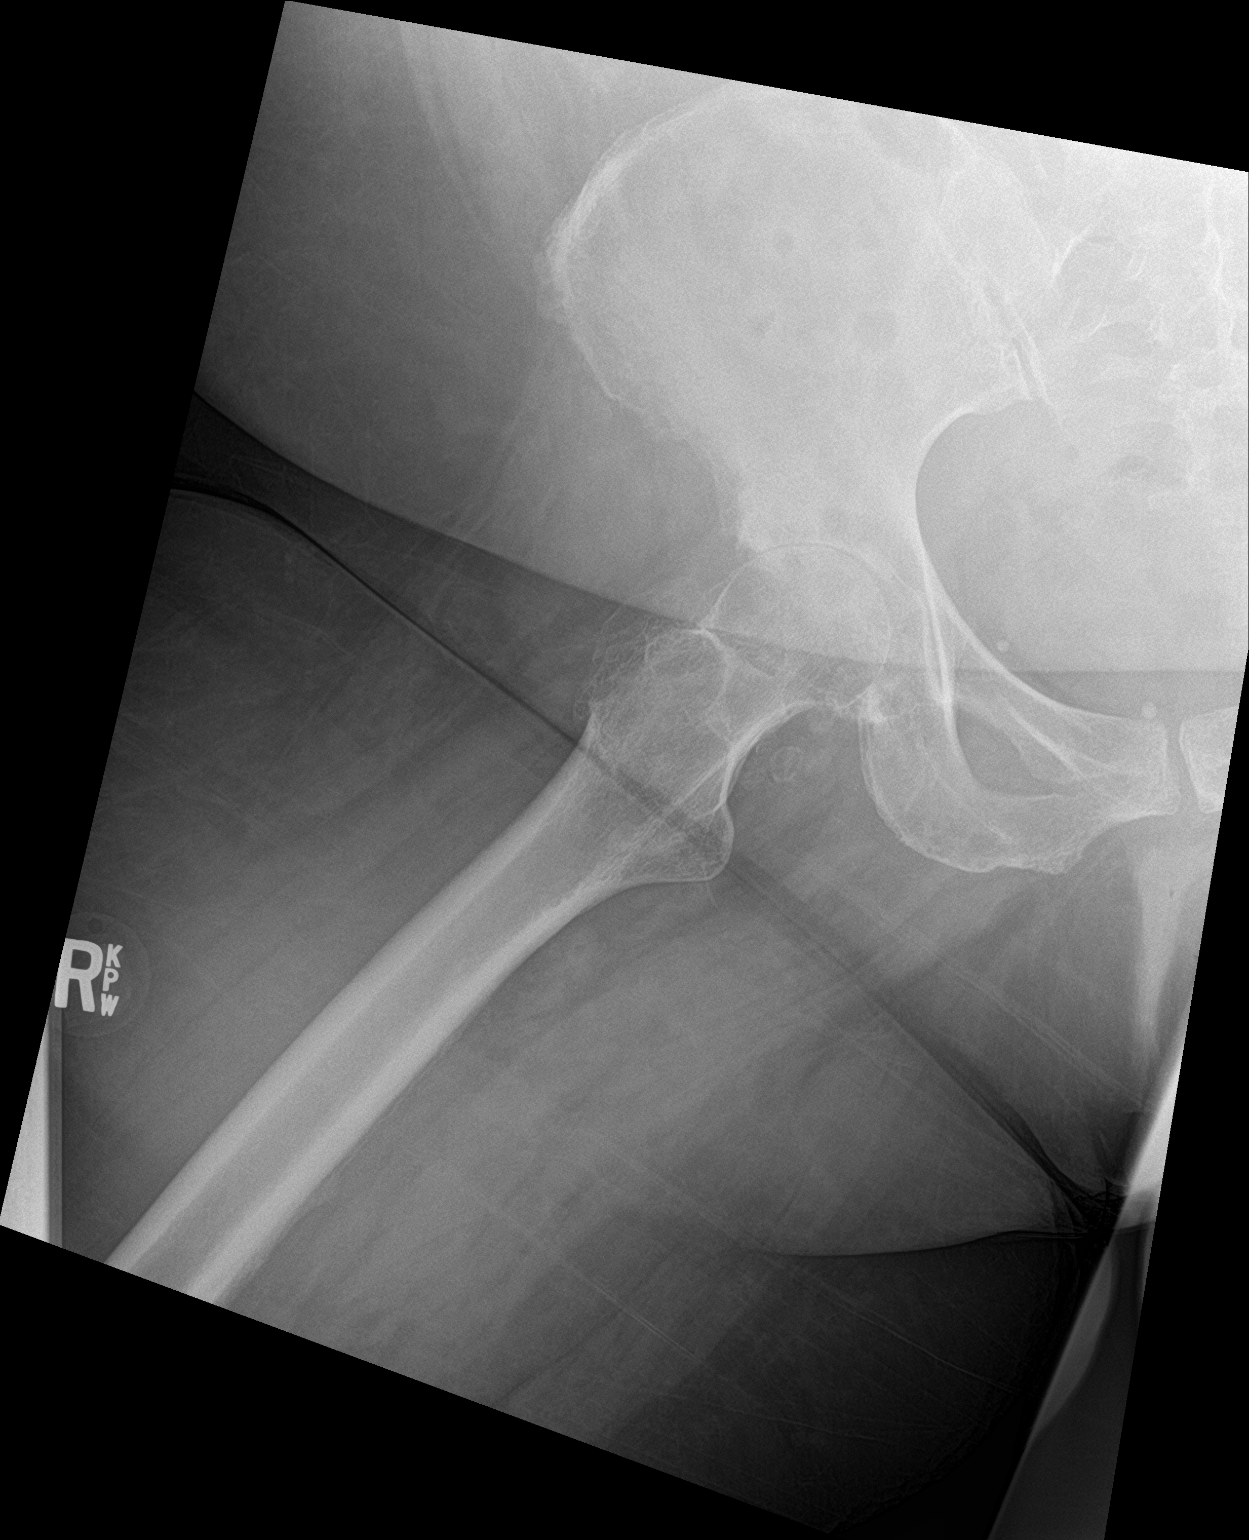

[3 of 3 positions shown; findings below may reference images not displayed]

FINDINGS: There is no acute bony or joint abnormality. The patient has severe
right hip osteoarthritis with bone-on-bone joint space narrowing,
subchondral sclerosis and some subchondral cyst formation. 1 cm in
diameter calcification projecting along the medial margin of the
femoral neck may be a loose body in the joint. Left hip joint space
is preserved. No lytic or sclerotic lesion. No avascular necrosis of
the femoral heads.
IMPRESSION: Advanced right hip osteoarthritis.

Negative left hip.

## 2020-08-27 IMAGING — CR DG HIP (WITH OR WITHOUT PELVIS) 2-3V*L*
3 series · 3 of 3 positions shown · non-contrast
Comparison: None.

CLINICAL DATA: Bilateral hip pain radiating into the legs for
years. No known injury.

EXAM:
DG HIP (WITH OR WITHOUT PELVIS) 2-3V LEFT; DG HIP (WITH OR WITHOUT
PELVIS) 2-3V RIGHT

[hip ap]
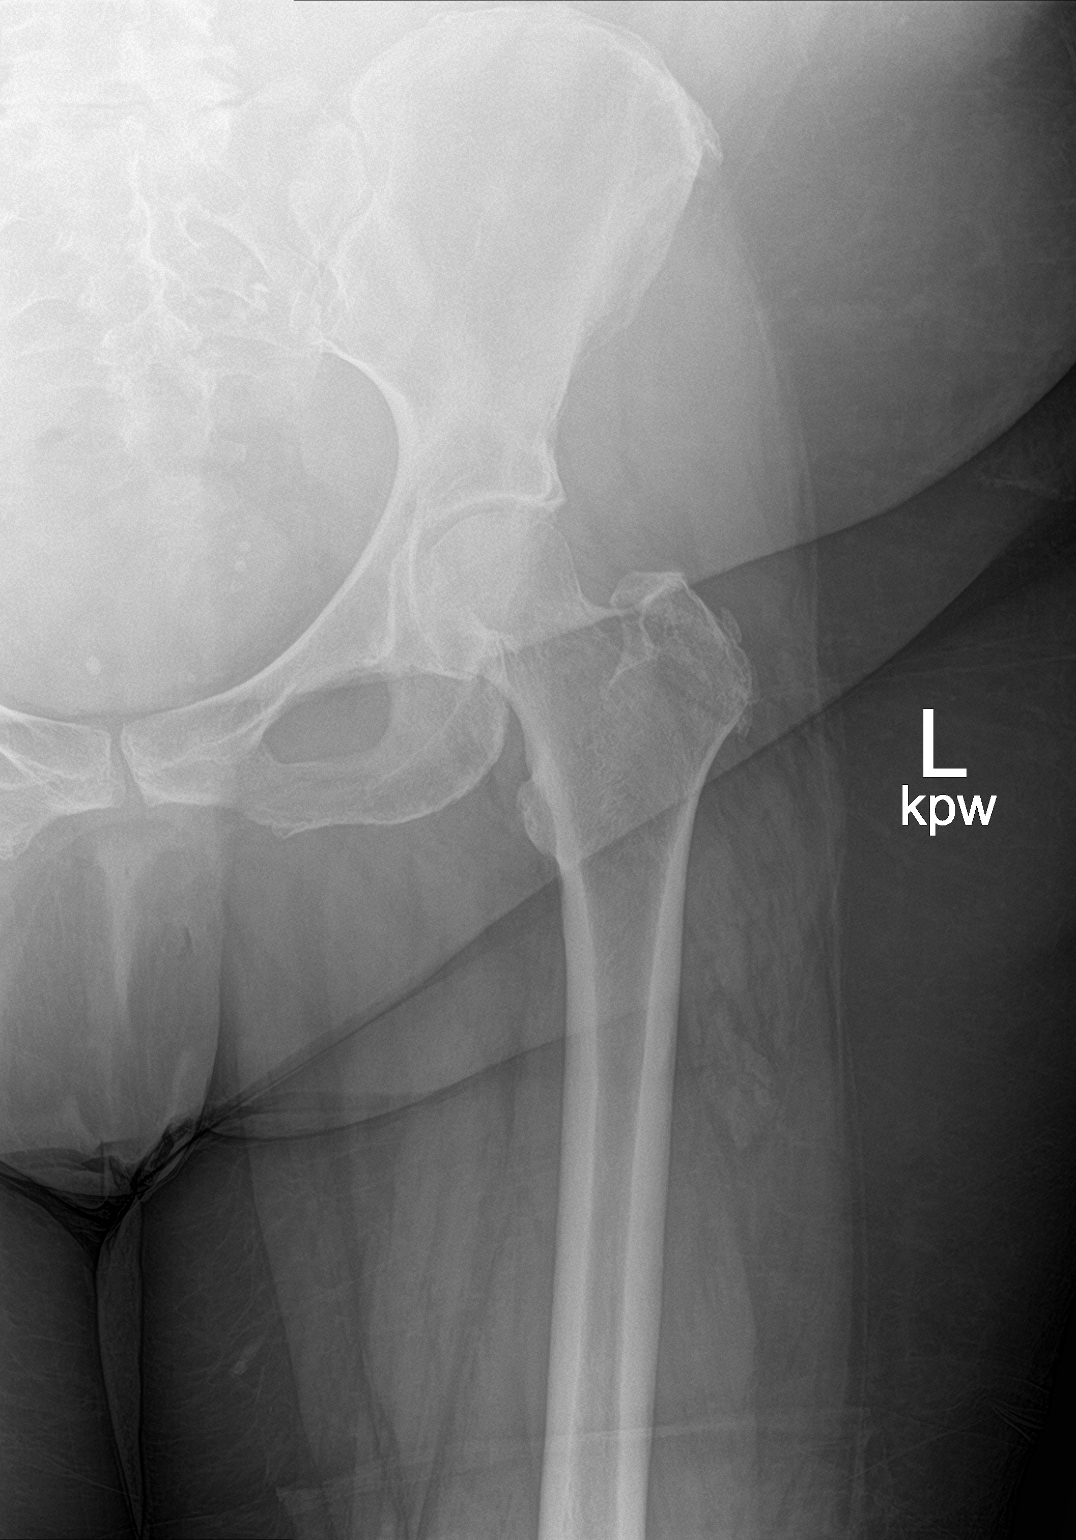

[pelvis ap]
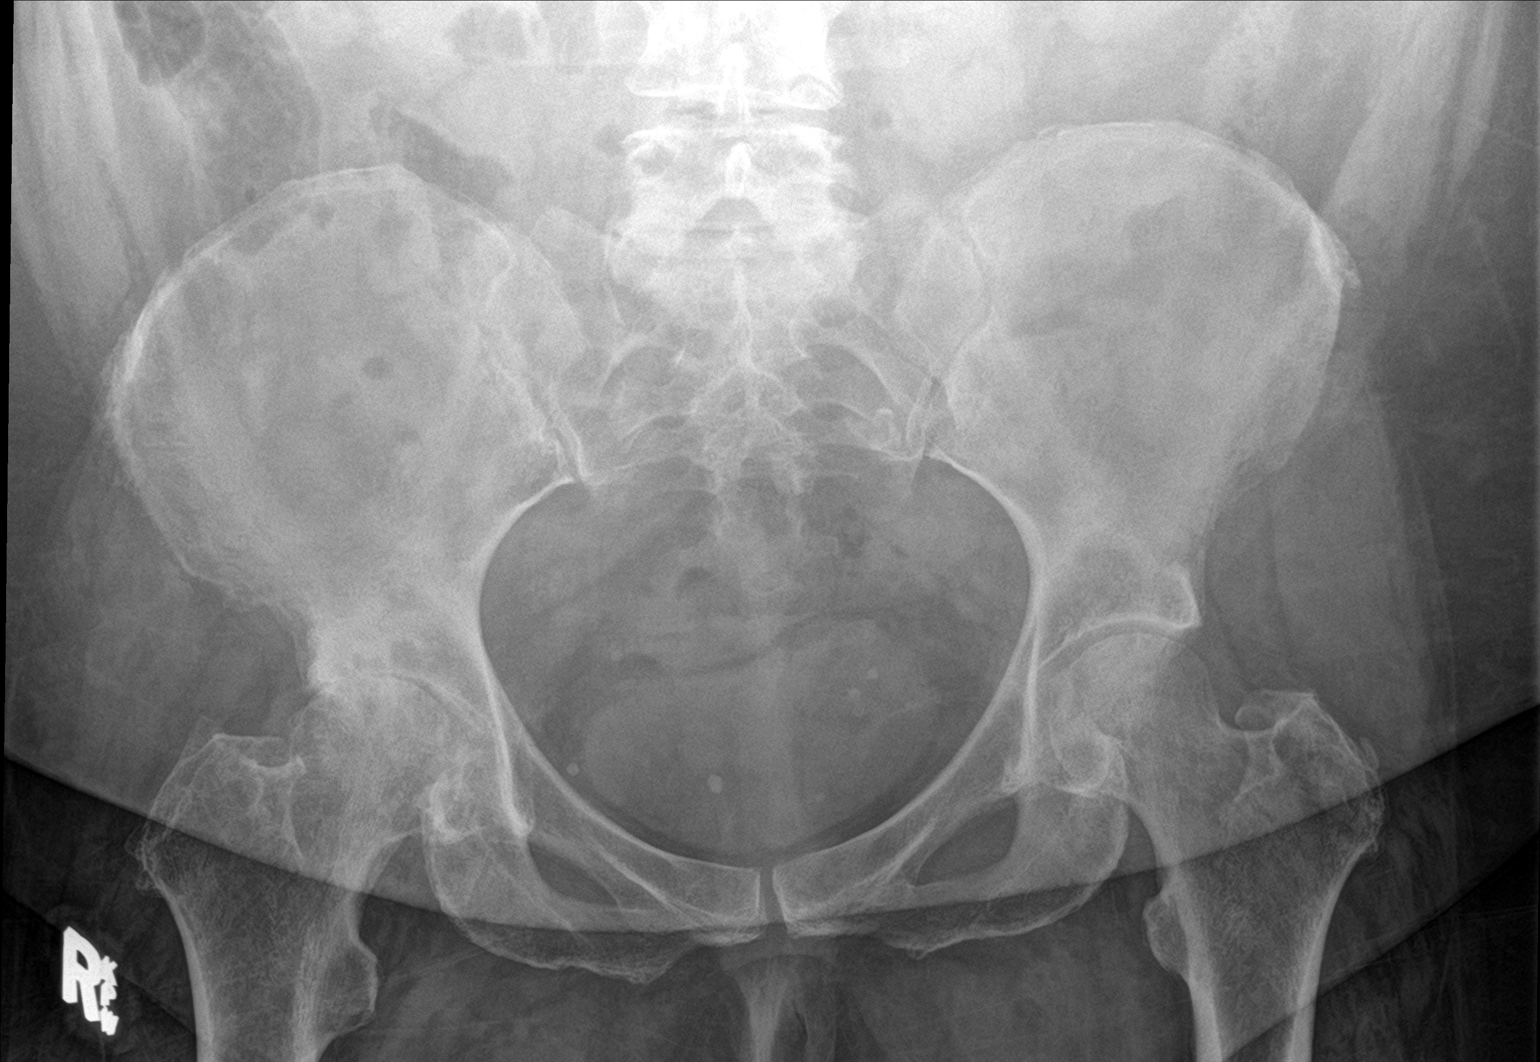

[hip lat]
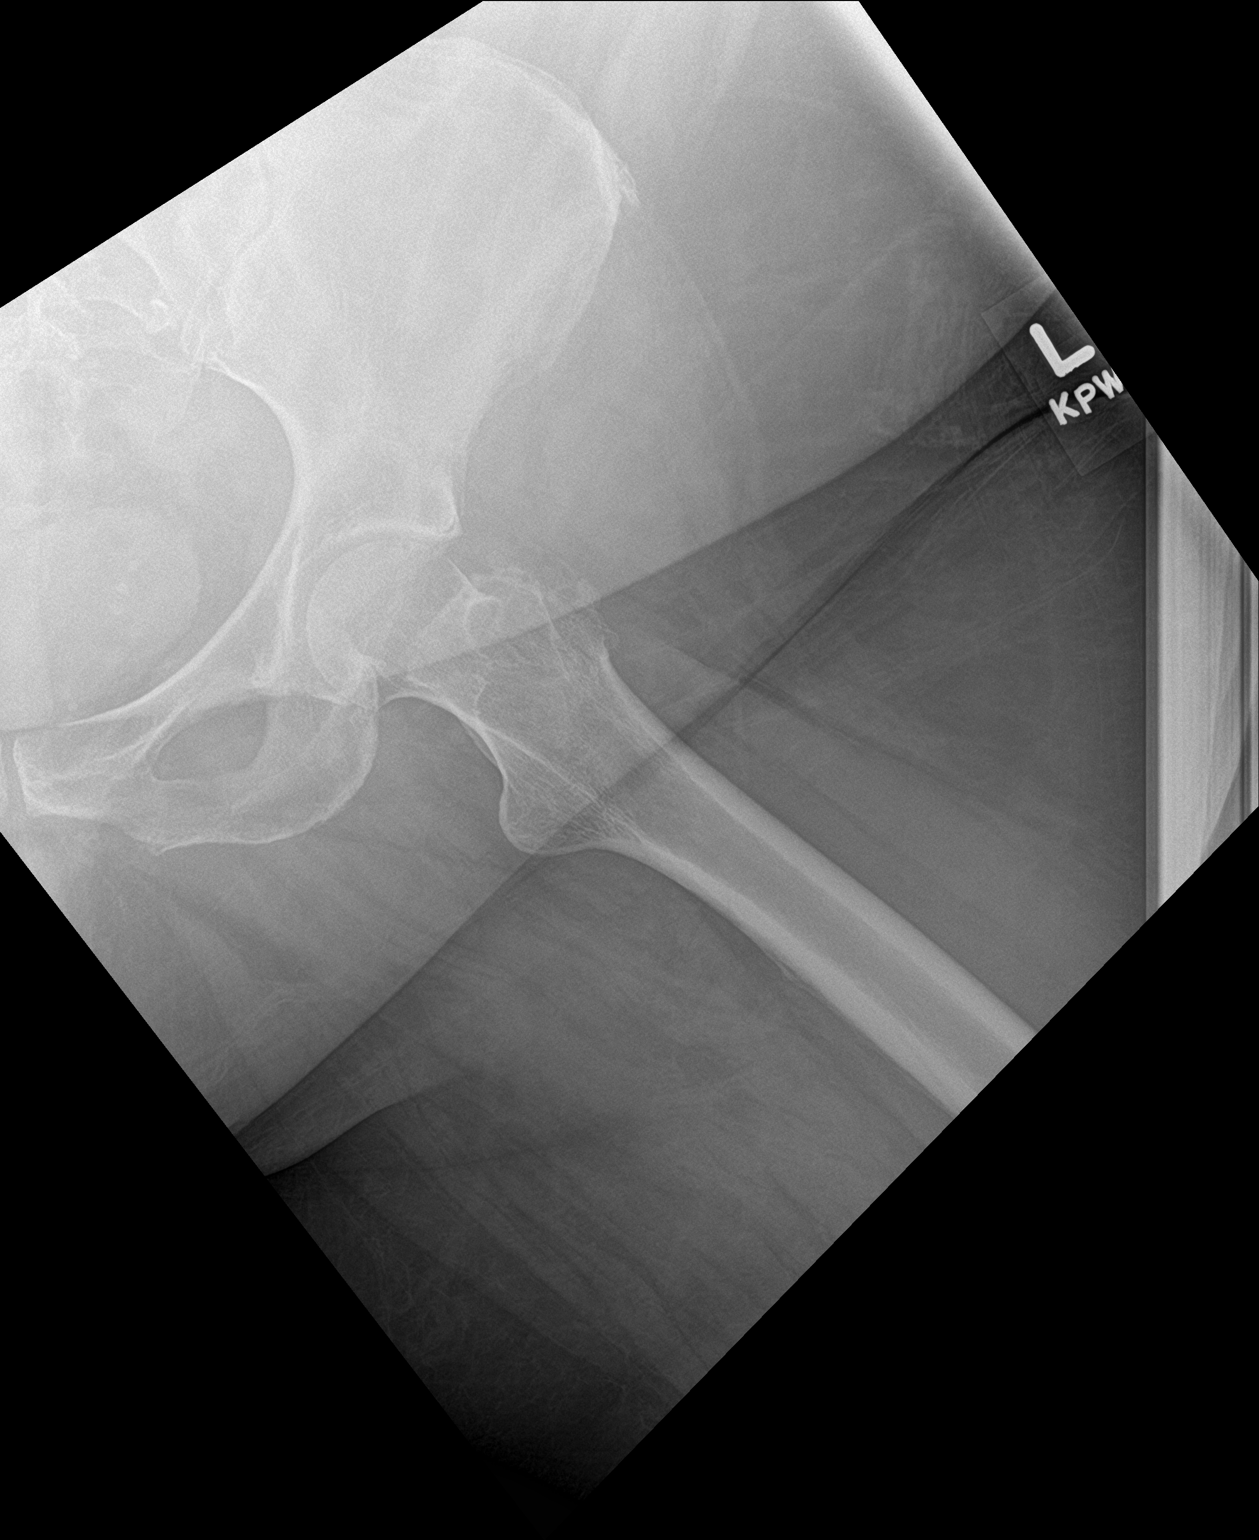

[3 of 3 positions shown; findings below may reference images not displayed]

FINDINGS: There is no acute bony or joint abnormality. The patient has severe
right hip osteoarthritis with bone-on-bone joint space narrowing,
subchondral sclerosis and some subchondral cyst formation. 1 cm in
diameter calcification projecting along the medial margin of the
femoral neck may be a loose body in the joint. Left hip joint space
is preserved. No lytic or sclerotic lesion. No avascular necrosis of
the femoral heads.
IMPRESSION: Advanced right hip osteoarthritis.

Negative left hip.

## 2020-11-11 ENCOUNTER — Other Ambulatory Visit: Payer: Self-pay | Admitting: Physician Assistant

## 2020-11-11 DIAGNOSIS — Z1231 Encounter for screening mammogram for malignant neoplasm of breast: Secondary | ICD-10-CM

## 2020-12-02 ENCOUNTER — Other Ambulatory Visit: Payer: Self-pay

## 2020-12-02 ENCOUNTER — Ambulatory Visit: Payer: Disability Insurance | Attending: Oncology

## 2020-12-02 ENCOUNTER — Ambulatory Visit
Admission: RE | Admit: 2020-12-02 | Discharge: 2020-12-02 | Disposition: A | Discharge status: Home / Self Care | Payer: Disability Insurance | Source: Ambulatory Visit | Attending: Oncology | Admitting: Oncology

## 2020-12-02 VITALS — BP 138/80 | HR 74 | Temp 97.7°F | Ht 61.42 in | Wt 227.0 lb

## 2020-12-02 DIAGNOSIS — Z0000 Encounter for general adult medical examination without abnormal findings: Secondary | ICD-10-CM | POA: Insufficient documentation

## 2020-12-02 NOTE — Progress Notes (Signed)
  Subjective:     Patient ID: Bianca Fitzpatrick, female   DOB: 04-26-1956, 65 y.o.   MRN: [id number]  HPI   Review of Systems     Objective:   Physical Exam Chest:  Breasts:    Right: No swelling, bleeding, inverted nipple, mass, nipple discharge, skin change or tenderness.     Left: No swelling, bleeding, inverted nipple, mass, nipple discharge, skin change or tenderness.       Assessment:     65 year old patient presents for [facility] clinic visit .  Patient screened, and meets [facility] eligibility.  Patient does not have insurance, Medicare or Medicaid.  Instructed patient on breast self awareness using teach back method.   Clinical breast exam unremarkable.  No mass or lump palpated.  Patient seeing provider at [facility] for possible hip replacement.  Risk Assessment     Risk Scores       12/02/2020   Last edited by: Rico Junker, RN   5-year risk: 1.2 %   Lifetime risk: 5.3 %               Plan:     Sent for bilateral screening mammogram.

## 2020-12-03 ENCOUNTER — Encounter: Payer: Self-pay | Admitting: Physician Assistant

## 2020-12-05 NOTE — Progress Notes (Signed)
Letter mailed from [facility] to notify of normal mammogram results.  Patient to return in one year for annual screening.  Copy to [facility]. 

## 2022-01-14 ENCOUNTER — Other Ambulatory Visit: Payer: Self-pay | Admitting: Student

## 2022-01-14 DIAGNOSIS — Z1231 Encounter for screening mammogram for malignant neoplasm of breast: Secondary | ICD-10-CM

## 2022-01-29 ENCOUNTER — Other Ambulatory Visit: Payer: Self-pay | Admitting: Nurse Practitioner

## 2022-01-29 DIAGNOSIS — Z1231 Encounter for screening mammogram for malignant neoplasm of breast: Secondary | ICD-10-CM

## 2022-02-23 ENCOUNTER — Ambulatory Visit
Admission: RE | Admit: 2022-02-23 | Discharge: 2022-02-23 | Disposition: A | Discharge status: Home / Self Care | Payer: Medicare Other | Source: Ambulatory Visit | Attending: Nurse Practitioner | Admitting: Nurse Practitioner

## 2022-02-23 DIAGNOSIS — Z1231 Encounter for screening mammogram for malignant neoplasm of breast: Secondary | ICD-10-CM | POA: Insufficient documentation

## 2023-01-27 ENCOUNTER — Other Ambulatory Visit: Payer: Self-pay | Admitting: Physician Assistant

## 2023-01-27 DIAGNOSIS — Z1231 Encounter for screening mammogram for malignant neoplasm of breast: Secondary | ICD-10-CM

## 2023-02-21 ENCOUNTER — Ambulatory Visit: Payer: Medicare HMO | Attending: Physician Assistant

## 2023-02-21 DIAGNOSIS — M6281 Muscle weakness (generalized): Secondary | ICD-10-CM | POA: Diagnosis present

## 2023-02-21 DIAGNOSIS — R262 Difficulty in walking, not elsewhere classified: Secondary | ICD-10-CM | POA: Insufficient documentation

## 2023-02-21 DIAGNOSIS — M5459 Other low back pain: Secondary | ICD-10-CM | POA: Insufficient documentation

## 2023-02-21 NOTE — Therapy (Addendum)
OUTPATIENT PHYSICAL THERAPY BACK EVALUATION  Patient Name: Bianca Fitzpatrick MRN: [id number] DOB:[birth year], 67 y.o., female Today's Date: 02/21/2023   PT End of Session - 02/21/23 0817     Visit Number 1    Number of Visits 17    Date for PT Re-Evaluation 04/18/23    Authorization Type Eval: 02/21/2023    PT Start Time 0816    PT Stop Time 0845    PT Time Calculation (min) 29 min    Activity Tolerance Patient tolerated treatment well    Behavior During Therapy [facility] for tasks assessed/performed             Past Medical History:  Diagnosis Date   GERD (gastroesophageal reflux disease)    Hyperlipidemia    Hypertension    Past Surgical History:  Procedure Laterality Date   BLADDER SUSPENSION     CARPAL TUNNEL RELEASE Right 2008   CESAREAN SECTION     x 2   COLONOSCOPY WITH PROPOFOL N/A 06/13/2015   Procedure: COLONOSCOPY WITH PROPOFOL;  Surgeon: Christena Deem, MD;  Location: [facility] ENDOSCOPY;  Service: Endoscopy;  Laterality: N/A;   ESOPHAGOGASTRODUODENOSCOPY (EGD) WITH PROPOFOL N/A 06/13/2015   Procedure: ESOPHAGOGASTRODUODENOSCOPY (EGD) WITH PROPOFOL;  Surgeon: Christena Deem, MD;  Location: [facility] ENDOSCOPY;  Service: Endoscopy;  Laterality: N/A;   right carpal tunnel release     There are no problems to display for this patient.   PCP: Dudley Major, FNP  REFERRING PROVIDER: Encarnacion Slates, PA*  REFERRING DIAG: [phone] (ICD-10-CM) - Presence of unspecified artificial hip joint   RATIONALE FOR EVALUATION AND TREATMENT: Rehabilitation  THERAPY DIAG: Difficulty in Walking, not elsewhere classified; Muscle Weakness; Other Low Back Pain   ONSET DATE: THA 07/13/21 (direct anterior approach)  FOLLOW-UP APPT SCHEDULED WITH REFERRING PROVIDER: Didn't address   SUBJECTIVE:                                                                                                                                                                                          SUBJECTIVE STATEMENT:  Lower Back Pain  PERTINENT HISTORY:   07/08/2022: (s/p R THA, 61yr)  Patient says hip pain has resolved, she did fall going down a step with resulting bruise on posterior lateral right thigh. She continues to have a 1 cm leg length discrepancy she manages with a shoe lift. Her back pain limits long distance walking   LEG LENGTH DISCREPANCY: Left 1 cm shorter than the right   09/08/2022: Per Chart Review:  Patient reported that she has had low back pain for several months. She feels like it started after hip surgery. She  has no significant pain when she sits or stands but her pain is significantly worse when she walks. She denies any radiation of pain. She has noticed a change in her gait mechanics. She feels looser in her hips compared to before the surgery.   Imaging:  There are 5 nonrib-bearing lumbar-type vertebral bodies. No visualized acute fracture. Multilevel intervertebral disc height loss, greatest and moderate-severe at L2-L3 and L5-S1. Multilevel endplate osteophyte formation greatest anteriorly at L2-L3. Trace L2-L3 retrolisthesis. L5-S1 facet arthropathy. Sacroiliac joints are grossly normal alignment.   Impression: Multilevel level lumbar degenerative disc disease, greatest at L2-L3 and L5-S1. Trace retrolisthesis of L2 on L3.   Today:  Patient arrives to physical therapy with a chief concern of lower back pain following THA 07/13/21. She reports a leg length discrepancy (right longer than the left leg). Since the surgery, she's had worsening lower back pain due to improper gait mechanics. Patient states that she has a shoe lift but hasn't had any relief of the pain. Patient reports balance problems and recent fall 3-4 weeks ago. She also reports dizziness upon sitting on edge of bed. She reports she has seen previous providers for BPPV without success. Denies radiating pain, fevers, chills, abdominal pain, saddle paraesthesia, loss of b/b.    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 Yes. Number of falls 1  Living Environment Lives with: Roommate  Lives in: House/apartment  Patient Goals: Patient would like to get strong enough to perform hiking and sightseeing.   OBJECTIVE:  Patient Surveys  FOTO 35, predicted value 54  Cognition Within Normal Limits     Gross Musculoskeletal Assessment Tremor: None Bulk: Normal Tone: Normal No visible step-off along spinal column, no signs of scoliosis  GAIT: (Deferred):  Posture: Lumbar lordosis: WNL Iliac crest height: Deferred Lumbar lateral shift: Negative  AROM AROM (Normal range in degrees) AROM   Lumbar   Flexion (65) WNL   Extension (30) WNL/Pain with OP  Right lateral flexion (25) WNL  Left lateral flexion (25) WNL/Pain with End Range  Right rotation (30) WNL  Left rotation (30) WNL/Pain with End Range      Hip Right Left  Flexion (125)    Extension (15)    Abduction (40)    Adduction     Internal Rotation (45)    External Rotation (45)        Knee    Flexion (135)    Extension (0)        Ankle    Dorsiflexion (20)    Plantarflexion (50)    Inversion (35)     Eversion (15)    (* = pain; Blank rows = not tested)  LE MMT: MMT (out of 5) Right  Left   Hip flexion 4* 3*  Hip extension  Hip abduction    Hip adduction    Hip internal rotation    Hip external rotation    Knee flexion    Knee extension 4 4  Ankle dorsiflexion 4 4  Ankle plantarflexion 5 5  Ankle inversion    Ankle eversion    (* = pain; Blank rows = not tested)  Sensation Deferred  Reflexes Deferred  Muscle Length Hamstrings: R: Not Performed L: 30  Ely (quadriceps): R: Not Performed L: Not Performed  Thomas (hip flexors): R: Not Performed L: Not Performed  Ober: R: Not Performed L: Not Performed  Palpation Location Right Left         Lumbar paraspinals 1 1  Quadratus Lumborum    Gluteus Maximus    Gluteus Medius    Deep hip external rotators    PSIS    Fortin's Area (SIJ)    Greater Trochanter    (Blank rows = not tested) Graded on 0-4 scale (0 = no pain, 1 = pain, 2 = pain with wincing/grimacing/flinching, 3 = pain with withdrawal, 4 = unwilling to allow palpation)  Passive Accessory Intervertebral Motion Deferred  Special Tests Lumbar Radiculopathy and Discogenic: Centralization and Peripheralization (SN 92, -LR 0.12): Deferred Slump (SN 83, -LR 0.32): R: L: Deferred SLR (SN 92, -LR 0.29): R: L:  Deferred Crossed SLR (SP 90): R: L: Deferred  Facet Joint: Extension-Rotation (SN 100, -LR 0.0): R: Negative L: Postive for Pain  Lumbar Foraminal Stenosis: Lumbar quadrant (SN 70): R: Deferred  Hip: FABER (SN 81): R: Negative L: Negative FADIR (SN 94): R: Negative L: Negative Hip scour (SN 50): R: Negative L: Negative  SIJ:  Thigh Thrust (SN 88, -LR 0.18) : R: Not done L: Not done  Piriformis Syndrome: FAIR Test (SN 88, SP 83): R: Not done L: Not done  Functional Tasks (Not Performed): Lifting: Deferred Deep squat: Deferred Sit to stand: Deferred   TODAY'S TREATMENT: Deferred   PATIENT EDUCATION:  Education details: Plan of  Care Person educated: Patient Education method: Explanation Education comprehension: verbalized understanding   HOME EXERCISE PROGRAM:  None currently   ASSESSMENT:  CLINICAL IMPRESSION: Patient is a 67 y.o. female who was seen today for physical therapy evaluation and treatment for lower back pain. Prior to her injury, pt reported ability to walk longer distances without lumbago or LOB. Currently, she's independent with functional mobility and activities of daily living without use of AD. She lives in a single level home with level entry and has assistance from her roommate PRN. Initial evaluation session limited due to time constraint. Objective measures significant with pain throughout lumbar range of motion and palpation of the lumbar paraspinals. Based on today's performance, patients symptoms consistent with decreased core stability and impaired gait mechanics. Informal gait assessment in the hallway presented with lateral sway and decreased step/stride length. PT recommends 1-2x a week of skilled physical therapy in order to improve functional mobility, LE strength, and balance.   OBJECTIVE IMPAIRMENTS: decreased balance, decreased endurance, decreased mobility, difficulty walking, decreased strength, dizziness, and pain.   ACTIVITY LIMITATIONS: standing, squatting, and stairs  PARTICIPATION LIMITATIONS: shopping and community activity  PERSONAL FACTORS: Age, Past/current experiences, Sex, Time since onset of injury/illness/exacerbation, and 1 comorbidity: HTN  are also affecting patient's functional outcome.   REHAB POTENTIAL: Good  CLINICAL DECISION MAKING: Evolving/moderate complexity  EVALUATION COMPLEXITY: Moderate   GOALS: Goals reviewed with patient? No  SHORT TERM GOALS: Target date: 03/21/2023  Pt will be independent with HEP in order  to improve strength and decrease back pain to improve pain-free function at home and work. Baseline:   Goal status:  INITIAL   LONG TERM GOALS: Target date: 04/18/2023  Pt will increase FOTO to at least 54 to demonstrate significant improvement in function at home and work related to back pain  Baseline: 02/21/23: 35 Goal status: INITIAL  2.  Pt will decrease worst back pain by at least 2 points on the NPRS in order to demonstrate clinically significant reduction in back pain. Baseline: 02/21/2023: 8/10 Goal status: INITIAL  3.  Pt will decrease mODI score by at least 13 points in order demonstrate clinically significant reduction in back pain/disability.       Baseline: 02/21/23: To be completed Goal status: INITIAL  4. Pt will report ability to walk longer distances (~ 30 min) without lower back pain in order to demonstrate improvements with functional mobility.  Baseline:  Goal status: INITIAL   PLAN: PT FREQUENCY: 1-2x/week  PT DURATION: 8 weeks  PLANNED INTERVENTIONS: Therapeutic exercises, Therapeutic activity, Neuromuscular re-education, Balance training, Gait training, Patient/Family education, Self Care, Joint mobilization, Joint manipulation, Vestibular training, Canalith repositioning, Orthotic/Fit training, DME instructions, Dry Needling, Electrical stimulation, Spinal manipulation, Spinal mobilization, Cryotherapy, Moist heat, Taping, Traction, Ultrasound, Ionotophoresis 4mg /ml Dexamethasone, Manual therapy, and Re-evaluation.  PLAN FOR NEXT SESSION: Repeated Motions, Hip ROM and MMT, Gait Assessment, mODI, Initiate strengthening program and HEP.   Doran Stabler, SPT Lynnea Maizes PT, DPT, GCS  Huprich,Jason, PT 02/21/2023, 8:41 PM

## 2023-02-23 ENCOUNTER — Ambulatory Visit: Payer: Medicare HMO

## 2023-02-25 ENCOUNTER — Ambulatory Visit
Admission: RE | Admit: 2023-02-25 | Discharge: 2023-02-25 | Disposition: A | Discharge status: Home / Self Care | Payer: Medicare HMO | Source: Ambulatory Visit | Attending: Physician Assistant | Admitting: Physician Assistant

## 2023-02-25 DIAGNOSIS — Z1231 Encounter for screening mammogram for malignant neoplasm of breast: Secondary | ICD-10-CM | POA: Insufficient documentation

## 2023-02-28 ENCOUNTER — Ambulatory Visit: Payer: Medicare HMO | Attending: Physician Assistant

## 2023-02-28 DIAGNOSIS — M6281 Muscle weakness (generalized): Secondary | ICD-10-CM | POA: Diagnosis present

## 2023-02-28 DIAGNOSIS — R262 Difficulty in walking, not elsewhere classified: Secondary | ICD-10-CM | POA: Insufficient documentation

## 2023-02-28 NOTE — Therapy (Addendum)
OUTPATIENT PHYSICAL THERAPY BACK EVALUATION  Patient Name: Bianca Fitzpatrick MRN: [id number] DOB:[birth year], 67 y.o., female Today's Date: 02/21/2023   PT End of Session - 02/28/23 0804     Visit Number 2    Number of Visits 17    Date for PT Re-Evaluation 04/18/23    Authorization Type Eval: 02/21/2023    PT Start Time 0801    PT Stop Time 0854    PT Time Calculation (min) 53 min    Activity Tolerance Patient tolerated treatment well    Behavior During Therapy [facility] for tasks assessed/performed             Past Medical History:  Diagnosis Date   GERD (gastroesophageal reflux disease)    Hyperlipidemia    Hypertension    Past Surgical History:  Procedure Laterality Date   BLADDER SUSPENSION     CARPAL TUNNEL RELEASE Right 2008   CESAREAN SECTION     x 2   COLONOSCOPY WITH PROPOFOL N/A 06/13/2015   Procedure: COLONOSCOPY WITH PROPOFOL;  Surgeon: Christena Deem, MD;  Location: [facility] ENDOSCOPY;  Service: Endoscopy;  Laterality: N/A;   ESOPHAGOGASTRODUODENOSCOPY (EGD) WITH PROPOFOL N/A 06/13/2015   Procedure: ESOPHAGOGASTRODUODENOSCOPY (EGD) WITH PROPOFOL;  Surgeon: Christena Deem, MD;  Location: [facility] ENDOSCOPY;  Service: Endoscopy;  Laterality: N/A;   right carpal tunnel release     There are no problems to display for this patient.   PCP: Dudley Major, FNP  REFERRING PROVIDER: Encarnacion Slates, PA*  REFERRING DIAG: [phone] (ICD-10-CM) - Presence of unspecified artificial hip joint   RATIONALE FOR EVALUATION AND TREATMENT: Rehabilitation  THERAPY DIAG: Difficulty in Walking, not elsewhere classified; Muscle Weakness; Other Low Back Pain   ONSET DATE: THA 07/13/21 (direct anterior approach)  FOLLOW-UP APPT SCHEDULED WITH REFERRING PROVIDER: Didn't address   SUBJECTIVE:                                                                                                                                                                                          SUBJECTIVE STATEMENT:  Lower Back Pain  PERTINENT HISTORY:   07/08/2022: (s/p R THA, 10yr)  Patient says hip pain has resolved, she did fall going down a step with resulting bruise on posterior lateral right thigh. She continues to have a 1 cm leg length discrepancy she manages with a shoe lift. Her back pain limits long distance walking   LEG LENGTH DISCREPANCY: Left 1 cm shorter than the right   09/08/2022: Per Chart Review:  Patient reported that she has had low back pain for several months. She feels like it started after hip surgery. She  has no significant pain when she sits or stands but her pain is significantly worse when she walks. She denies any radiation of pain. She has noticed a change in her gait mechanics. She feels looser in her hips compared to before the surgery.   Imaging:  There are 5 nonrib-bearing lumbar-type vertebral bodies. No visualized acute fracture. Multilevel intervertebral disc height loss, greatest and moderate-severe at L2-L3 and L5-S1. Multilevel endplate osteophyte formation greatest anteriorly at L2-L3. Trace L2-L3 retrolisthesis. L5-S1 facet arthropathy. Sacroiliac joints are grossly normal alignment.   Impression: Multilevel level lumbar degenerative disc disease, greatest at L2-L3 and L5-S1. Trace retrolisthesis of L2 on L3.   Today:  Patient arrives to physical therapy with a chief concern of lower back pain following THA 07/13/21. She reports a leg length discrepancy (right longer than the left leg). Since the surgery, she's had worsening lower back pain due to improper gait mechanics. Patient states that she has a shoe lift but hasn't had any relief of the pain. Patient reports balance problems and recent fall 3-4 weeks ago. She also reports dizziness upon sitting on edge of bed. She reports she has seen previous providers for BPPV without success. Denies radiating pain, fevers, chills, abdominal pain, saddle paraesthesia, loss of b/b.    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 Yes. Number of falls 1  Living Environment Lives with: Roommate  Lives in: House/apartment  Patient Goals: Patient would like to get strong enough to perform hiking and sightseeing.   OBJECTIVE:  Patient Surveys  FOTO 35, predicted value 54  Cognition Within Normal Limits     Gross Musculoskeletal Assessment Tremor: None Bulk: Normal Tone: Normal No visible step-off along spinal column, no signs of scoliosis  GAIT: (Deferred):  Posture: Lumbar lordosis: WNL Iliac crest height: Deferred Lumbar lateral shift: Negative  AROM AROM (Normal range in degrees) AROM   Lumbar   Flexion (65) WNL   Extension (30) WNL/Pain with OP  Right lateral flexion (25) WNL  Left lateral flexion (25) WNL/Pain with End Range  Right rotation (30) WNL  Left rotation (30) WNL/Pain with End Range      Hip Right Left  Flexion (125)    Extension (15)    Abduction (40)    Adduction     Internal Rotation (45)    External Rotation (45)        Knee    Flexion (135)    Extension (0)        Ankle    Dorsiflexion (20)    Plantarflexion (50)    Inversion (35)     Eversion (15)    (* = pain; Blank rows = not tested)  LE MMT: MMT (out of 5) Right  Left   Hip flexion 4* 3*  Hip extension  Hip abduction    Hip adduction    Hip internal rotation    Hip external rotation    Knee flexion    Knee extension 4 4  Ankle dorsiflexion 4 4  Ankle plantarflexion 5 5  Ankle inversion    Ankle eversion    (* = pain; Blank rows = not tested)  Sensation Deferred  Reflexes Deferred  Muscle Length Hamstrings: R: Not Performed L: 30  Ely (quadriceps): R: Not Performed L: Not Performed  Thomas (hip flexors): R: Not Performed L: Not Performed  Ober: R: Not Performed L: Not Performed  Palpation Location Right Left         Lumbar paraspinals 1 1  Quadratus Lumborum    Gluteus Maximus    Gluteus Medius    Deep hip external rotators    PSIS    Fortin's Area (SIJ)    Greater Trochanter    (Blank rows = not tested) Graded on 0-4 scale (0 = no pain, 1 = pain, 2 = pain with wincing/grimacing/flinching, 3 = pain with withdrawal, 4 = unwilling to allow palpation)  Passive Accessory Intervertebral Motion Deferred  Special Tests Lumbar Radiculopathy and Discogenic: Centralization and Peripheralization (SN 92, -LR 0.12): Deferred Slump (SN 83, -LR 0.32): R: L: Deferred SLR (SN 92, -LR 0.29): R: L:  Deferred Crossed SLR (SP 90): R: L: Deferred  Facet Joint: Extension-Rotation (SN 100, -LR 0.0): R: Negative L: Postive for Pain  Lumbar Foraminal Stenosis: Lumbar quadrant (SN 70): R: Deferred  Hip: FABER (SN 81): R: Negative L: Negative FADIR (SN 94): R: Negative L: Negative Hip scour (SN 50): R: Negative L: Negative  SIJ:  Thigh Thrust (SN 88, -LR 0.18) : R: Not done L: Not done  Piriformis Syndrome: FAIR Test (SN 88, SP 83): R: Not done L: Not done  Functional Tasks (Not Performed): Lifting: Deferred Deep squat: Deferred Sit to stand: Deferred   TODAY'S TREATMENT:  Subjective: Patient arrived to physical therapy without new  reports. No further questions or concerns.   Pain: "Hurts a little bit" both knees, denies resting back pain.     There-ex:  LE ROM:  Hip Right Left  Flexion (125) WNL  WNL   Internal Rotation (45) WNL WNL  External Rotation (45) 11 20   LE MMT:        R       L Hip abduction 4- 4-  Hip internal rotation 4 4-  Hip external rotation 4          4-   Leg Length: R: 75 cm (ASIS to Medial Malleolus)  L: 74 cm (ASIS to Medial Malleolus)  BPPV Screening: Posterior Canal: Right Dix Hallpike: Negative for vertigo and nystagmus Left Dix Hallpike: Negative for vertigo and nystagmus  Horizontal Canal:  Right Head Roll Test: Negative for vertigo and nystagmus  Left Head Roll Test: Negative for Vertigo and nystagmus  Supine Posterior Pelvic Tilt 1 x 10 x 10s hold; Hooklying Bridge 1 x 10 x 5s;  Supine SLR (Left weaker than right) 2 x 10;   Educated on Lumbar Rotation Stretch, Crossover Stretch, Seated Lumbar Flexion Stretch;   Educated on proper form for HEP.    PATIENT EDUCATION:  Education details: Plan of Care Person educated: Patient Education method: Explanation Education comprehension: verbalized understanding   HOME EXERCISE PROGRAM:    ASSESSMENT:  CLINICAL IMPRESSION: Today's session with a main focus on assessing Hip ROM and introduction to strengthening. Patient continues to present with decreased  LE strength and limited ROM in hip mobility. PT screened patient for posterior and horizontal BPPV due to complaints of vertigo when rolling in bed. Patient without report of vertigo and nystagmus absent in dix-hallpike and head roll test positions bilaterally. Although patient presents with limb length discrepancy, pt endorses that prolonged gait affected due to lumbar pain. PT continues to recommend 1x per week of skilled physical therapy in order to improve functional mobility, LE strength, and balance.   OBJECTIVE IMPAIRMENTS: decreased balance, decreased endurance,  decreased mobility, difficulty walking, decreased strength, dizziness, and pain.   ACTIVITY LIMITATIONS: standing, squatting, and stairs  PARTICIPATION LIMITATIONS: shopping and community activity  PERSONAL FACTORS: Age, Past/current experiences, Sex, Time since onset of injury/illness/exacerbation, and 1 comorbidity: HTN  are also affecting patient's functional outcome.   REHAB POTENTIAL: Good  CLINICAL DECISION MAKING: Evolving/moderate complexity  EVALUATION COMPLEXITY: Moderate   GOALS: Goals reviewed with patient? No  SHORT TERM GOALS: Target date: 03/21/2023  Pt will be independent with HEP in order to improve strength and decrease back pain to improve pain-free function at home and work. Baseline:   Goal status: INITIAL   LONG TERM GOALS: Target date: 04/18/2023  Pt will increase FOTO to at least 54 to demonstrate significant improvement in function at home and work related to back pain  Baseline: 02/21/23: 35 Goal status: INITIAL  2.  Pt will decrease worst back pain by at least 2 points on the NPRS in order to demonstrate clinically significant reduction in back pain. Baseline: 02/21/2023: 8/10 Goal status: INITIAL  3.  Pt will decrease mODI score by at least 13 points in order demonstrate clinically significant reduction in back pain/disability.       Baseline: 02/21/23: n/a 02/28/2023: 44% Goal status: INITIAL  4. Pt will report ability to walk longer distances (~ 30 min) without lower back pain in order to demonstrate improvements with functional mobility.  Baseline:  Goal status: INITIAL   PLAN: PT FREQUENCY: 1-2x/week  PT DURATION: 8 weeks  PLANNED INTERVENTIONS: Therapeutic exercises, Therapeutic activity, Neuromuscular re-education, Balance training, Gait training, Patient/Family education, Self Care, Joint mobilization, Joint manipulation, Vestibular training, Canalith repositioning, Orthotic/Fit training, DME instructions, Dry Needling, Electrical  stimulation, Spinal manipulation, Spinal mobilization, Cryotherapy, Moist heat, Taping, Traction, Ultrasound, Ionotophoresis 4mg /ml Dexamethasone, Manual therapy, and Re-evaluation.  PLAN FOR NEXT SESSION: Repeated Motions, Hip ROM and MMT, Gait Assessment, mODI, Initiate strengthening program and HEP.   Chris Justis Dupas SPT Sharalyn Ink Huprich PT, DPT, GCS  Huprich,Jason, PT 02/28/2023, 1:10 PM

## 2023-03-07 ENCOUNTER — Ambulatory Visit: Payer: Medicare HMO

## 2023-03-10 ENCOUNTER — Ambulatory Visit: Payer: Medicare HMO

## 2023-03-14 ENCOUNTER — Ambulatory Visit: Payer: Medicare HMO

## 2023-03-17 ENCOUNTER — Ambulatory Visit: Payer: Medicare HMO

## 2023-03-21 ENCOUNTER — Ambulatory Visit: Payer: Medicare HMO

## 2023-03-24 ENCOUNTER — Ambulatory Visit: Payer: Medicare HMO

## 2023-03-28 ENCOUNTER — Ambulatory Visit: Payer: Medicare HMO

## 2023-10-10 ENCOUNTER — Telehealth: Payer: Self-pay

## 2023-10-10 DIAGNOSIS — Z122 Encounter for screening for malignant neoplasm of respiratory organs: Secondary | ICD-10-CM

## 2023-10-10 DIAGNOSIS — Z87891 Personal history of nicotine dependence: Secondary | ICD-10-CM

## 2023-10-10 DIAGNOSIS — F17210 Nicotine dependence, cigarettes, uncomplicated: Secondary | ICD-10-CM

## 2023-10-10 NOTE — Telephone Encounter (Signed)
 Lung Cancer Screening Narrative/Criteria Questionnaire (Cigarette Smokers Only- No Cigars/Pipes/vapes)   YUKARI FLAX   SDMV:10/28/2023 at 10:00 am with   08-31-1955   LDCT: 11/01/2023 at [facility] at 10:00 am     68 y.o.   Phone: [phone]  Lung Screening Narrative (confirm age 57-77 yrs Medicare / 50-80 yrs Private pay insurance)   Insurance information: UHC/ Medicare   Referring Provider:Jordan, MD   This screening involves an initial phone call with a team member from our program. It is called a shared decision making visit. The initial meeting is required by  insurance and Medicare to make sure you understand the program. This appointment takes about 15-20 minutes to complete. You will complete the screening scan at your scheduled date/time.  This scan takes about 5-10 minutes to complete. You can eat and drink normally before and after the scan.  Criteria questions for Lung Cancer Screening:   Are you a current or former smoker? Current Age began smoking: 45   If you are a former smoker, what year did you quit smoking? (within 15 yrs)   To calculate your smoking history, I need an accurate estimate of how many packs of cigarettes you smoked per day and for how many years. (Not just the number of PPD you are now smoking)   Years smoking 47 x Packs per day .75 = Pack years 35.25   (at least 20 pack yrs)   (Make sure they understand that we need to know how much they have smoked in the past, not just the number of PPD they are smoking now)  Do you have a personal history of cancer?  No    Do you have a family history of cancer? Yes  (cancer type and and relative) Mother Lung, Brother Liverand Brother Prostate   Are you coughing up blood?  No  Have you had unexplained weight loss of 15 lbs or more in the last 6 months? No  It looks like you meet all criteria.  When would be a good time for us  to schedule you for this screening?   Additional information: N/A

## 2023-10-28 ENCOUNTER — Encounter

## 2023-10-31 ENCOUNTER — Ambulatory Visit (INDEPENDENT_AMBULATORY_CARE_PROVIDER_SITE_OTHER): Admitting: Acute Care

## 2023-10-31 ENCOUNTER — Encounter: Payer: Self-pay | Admitting: Acute Care

## 2023-10-31 DIAGNOSIS — F17210 Nicotine dependence, cigarettes, uncomplicated: Secondary | ICD-10-CM | POA: Diagnosis not present

## 2023-10-31 NOTE — Progress Notes (Signed)
  Virtual Visit via Telephone Note  I connected with Bianca Fitzpatrick on 10/31/23 at  1:30 PM EDT by telephone and verified that I am speaking with the correct person using two identifiers.  Location: Patient: at home Provider: [address], [place], [place], [address]    I discussed the limitations, risks, security and privacy concerns of performing an evaluation and management service by telephone and the availability of in person appointments. I also discussed with the patient that there may be a patient responsible charge related to this service. The patient expressed understanding and agreed to proceed.   Shared Decision Making Visit Lung Cancer Screening Program [phone])   Eligibility: Age 68 y.o. Pack Years Smoking History Calculation 35.25 pack year smoking history (# packs/per year x # years smoked) Recent History of coughing up blood  no Unexplained weight loss? no ( >Than 15 pounds within the last 6 months ) Prior History Lung / other cancer no (Diagnosis within the last 5 years already requiring surveillance chest CT Scans). Smoking Status Current Smoker Former Smokers: Years since quit:  NA  Quit Date:  NA  Visit Components: Discussion included one or more decision making aids. yes Discussion included risk/benefits of screening. yes Discussion included potential follow up diagnostic testing for abnormal scans. yes Discussion included meaning and risk of over diagnosis. yes Discussion included meaning and risk of False Positives. yes Discussion included meaning of total radiation exposure. yes  Counseling Included: Importance of adherence to annual lung cancer LDCT screening. yes Impact of comorbidities on ability to participate in the program. yes Ability and willingness to under diagnostic treatment. yes  Smoking Cessation Counseling: Current Smokers:  Discussed importance of smoking cessation. yes Information about tobacco cessation classes and  interventions provided to patient. yes Patient provided with "ticket" for LDCT Scan. yes Symptomatic Patient. no  Counseling NA Diagnosis Code: Tobacco Use Z72.0 Asymptomatic Patient yes  Counseling (Intermediate counseling: > three minutes counseling) L2440 Former Smokers:  Discussed the importance of maintaining cigarette abstinence. yes Diagnosis Code: Personal History of Nicotine Dependence. N02.725 Information about tobacco cessation classes and interventions provided to patient. Yes Patient provided with "ticket" for LDCT Scan. yes Written Order for Lung Cancer Screening with LDCT placed in Epic. Yes (CT Chest Lung Cancer Screening Low Dose W/O CM) [id number] Z12.2-Screening of respiratory organs Z87.891-Personal history of nicotine dependence  Current smoker counseled to quit x 3 minutes today. Resources provided see AVS   Raejean Bullock, NP

## 2023-10-31 NOTE — Patient Instructions (Signed)

## 2023-11-01 ENCOUNTER — Ambulatory Visit
Admission: RE | Admit: 2023-11-01 | Discharge: 2023-11-01 | Disposition: A | Discharge status: Home / Self Care | Source: Ambulatory Visit | Attending: Nurse Practitioner | Admitting: Nurse Practitioner

## 2023-11-01 DIAGNOSIS — Z122 Encounter for screening for malignant neoplasm of respiratory organs: Secondary | ICD-10-CM | POA: Insufficient documentation

## 2023-11-01 DIAGNOSIS — Z87891 Personal history of nicotine dependence: Secondary | ICD-10-CM | POA: Insufficient documentation

## 2023-11-01 DIAGNOSIS — F17210 Nicotine dependence, cigarettes, uncomplicated: Secondary | ICD-10-CM | POA: Diagnosis present

## 2023-11-16 ENCOUNTER — Other Ambulatory Visit: Payer: Self-pay

## 2023-11-16 DIAGNOSIS — F17210 Nicotine dependence, cigarettes, uncomplicated: Secondary | ICD-10-CM

## 2023-11-16 DIAGNOSIS — Z122 Encounter for screening for malignant neoplasm of respiratory organs: Secondary | ICD-10-CM

## 2023-11-16 DIAGNOSIS — Z87891 Personal history of nicotine dependence: Secondary | ICD-10-CM

## 2024-03-01 ENCOUNTER — Telehealth: Payer: Self-pay

## 2024-03-01 NOTE — Telephone Encounter (Signed)
 Olam called in from [facility] to check on the patient's referral for her procedure. I informed her that the patient is not registered with our practice and advised her to contact [facility] for further assistance.

## 2024-04-05 ENCOUNTER — Other Ambulatory Visit: Payer: Self-pay | Admitting: Nurse Practitioner

## 2024-04-05 DIAGNOSIS — Z1231 Encounter for screening mammogram for malignant neoplasm of breast: Secondary | ICD-10-CM
